# Patient Record
Sex: Female | Born: 1955
Health system: Southern US, Community
[De-identification: ages and names within clinical notes are randomized; demographics above are authoritative.]

## PROBLEM LIST (undated history)

## (undated) DIAGNOSIS — C801 Malignant (primary) neoplasm, unspecified: Secondary | ICD-10-CM

## (undated) DIAGNOSIS — Z96643 Presence of artificial hip joint, bilateral: Secondary | ICD-10-CM

## (undated) DIAGNOSIS — M1990 Unspecified osteoarthritis, unspecified site: Secondary | ICD-10-CM

## (undated) DIAGNOSIS — E876 Hypokalemia: Secondary | ICD-10-CM

## (undated) DIAGNOSIS — F419 Anxiety disorder, unspecified: Secondary | ICD-10-CM

## (undated) DIAGNOSIS — M129 Arthropathy, unspecified: Secondary | ICD-10-CM

## (undated) DIAGNOSIS — F32A Depression, unspecified: Secondary | ICD-10-CM

## (undated) DIAGNOSIS — F329 Major depressive disorder, single episode, unspecified: Secondary | ICD-10-CM

## (undated) HISTORY — DX: Arthropathy, unspecified: M12.9

## (undated) HISTORY — DX: Morbid (severe) obesity due to excess calories: E66.01

## (undated) HISTORY — DX: Unspecified osteoarthritis, unspecified site: M19.90

## (undated) HISTORY — DX: Major depressive disorder, single episode, unspecified: F32.9

## (undated) HISTORY — DX: Depression, unspecified: F32.A

## (undated) HISTORY — PX: ABDOMINAL HYSTERECTOMY: SHX81

## (undated) HISTORY — DX: Hypokalemia: E87.6

## (undated) HISTORY — DX: Presence of artificial hip joint, bilateral: Z96.643

## (undated) HISTORY — DX: Anxiety disorder, unspecified: F41.9

## (undated) HISTORY — PX: BREAST BIOPSY: SHX20

## (undated) HISTORY — PX: OTHER SURGICAL HISTORY: SHX169

---

## 1998-07-31 ENCOUNTER — Encounter: Payer: Self-pay | Admitting: Emergency Medicine

## 1998-07-31 ENCOUNTER — Ambulatory Visit (HOSPITAL_COMMUNITY): Admission: RE | Admit: 1998-07-31 | Discharge: 1998-07-31 | Payer: Self-pay | Admitting: Emergency Medicine

## 1999-02-09 ENCOUNTER — Ambulatory Visit (HOSPITAL_COMMUNITY): Admission: RE | Admit: 1999-02-09 | Discharge: 1999-02-09 | Payer: Self-pay | Admitting: Emergency Medicine

## 1999-02-09 ENCOUNTER — Encounter: Payer: Self-pay | Admitting: Emergency Medicine

## 1999-12-31 ENCOUNTER — Other Ambulatory Visit: Admission: RE | Admit: 1999-12-31 | Discharge: 1999-12-31 | Payer: Self-pay | Admitting: Obstetrics and Gynecology

## 2010-10-28 LAB — HM PAP SMEAR: HM Pap smear: NORMAL

## 2010-12-15 ENCOUNTER — Ambulatory Visit: Payer: Self-pay | Admitting: Family Medicine

## 2011-03-04 ENCOUNTER — Ambulatory Visit: Payer: Self-pay | Admitting: Family Medicine

## 2011-03-24 ENCOUNTER — Ambulatory Visit: Payer: Self-pay | Admitting: Gastroenterology

## 2011-03-24 LAB — HM COLONOSCOPY

## 2011-03-26 LAB — PATHOLOGY REPORT

## 2011-05-14 ENCOUNTER — Ambulatory Visit: Payer: Self-pay | Admitting: Family Medicine

## 2011-05-14 LAB — HCG, QUANTITATIVE, PREGNANCY: Beta Hcg, Quant.: 3 m[IU]/mL

## 2011-07-08 ENCOUNTER — Ambulatory Visit: Payer: Self-pay | Admitting: Obstetrics and Gynecology

## 2011-12-04 ENCOUNTER — Emergency Department: Payer: Self-pay | Admitting: Emergency Medicine

## 2011-12-04 LAB — COMPREHENSIVE METABOLIC PANEL
Albumin: 3.6 g/dL (ref 3.4–5.0)
Alkaline Phosphatase: 111 U/L (ref 50–136)
Anion Gap: 11 (ref 7–16)
BUN: 20 mg/dL — ABNORMAL HIGH (ref 7–18)
Bilirubin,Total: 0.3 mg/dL (ref 0.2–1.0)
Calcium, Total: 8.8 mg/dL (ref 8.5–10.1)
Chloride: 106 mmol/L (ref 98–107)
Co2: 24 mmol/L (ref 21–32)
Creatinine: 0.68 mg/dL (ref 0.60–1.30)
EGFR (African American): >60
EGFR (Non-African Amer.): >60
Glucose: 122 mg/dL — ABNORMAL HIGH (ref 65–99)
Osmolality: 285 (ref 275–301)
Potassium: 3 mmol/L — ABNORMAL LOW (ref 3.5–5.1)
SGOT(AST): 24 U/L (ref 15–37)
SGPT (ALT): 21 U/L (ref 12–78)
Sodium: 141 mmol/L (ref 136–145)
Total Protein: 7.6 g/dL (ref 6.4–8.2)

## 2011-12-04 LAB — CBC
HCT: 41.1 % (ref 35.0–47.0)
HGB: 13.8 g/dL (ref 12.0–16.0)
MCH: 30.2 pg (ref 26.0–34.0)
MCHC: 33.6 g/dL (ref 32.0–36.0)
MCV: 90 fL (ref 80–100)
Platelet: 314 10*3/uL (ref 150–440)
RBC: 4.57 10*6/uL (ref 3.80–5.20)
RDW: 13.9 % (ref 11.5–14.5)
WBC: 10.4 10*3/uL (ref 3.6–11.0)

## 2011-12-04 LAB — TROPONIN I: Troponin-I: <0.02 ng/mL

## 2012-01-04 ENCOUNTER — Ambulatory Visit: Payer: Self-pay | Admitting: Obstetrics and Gynecology

## 2012-06-09 ENCOUNTER — Emergency Department: Payer: Self-pay | Admitting: Emergency Medicine

## 2012-06-09 LAB — URINALYSIS, COMPLETE
Bilirubin,UR: NEGATIVE
Ketone: NEGATIVE
Leukocyte Esterase: NEGATIVE
Nitrite: NEGATIVE
Ph: 7 (ref 4.5–8.0)
Specific Gravity: 1.011 (ref 1.003–1.030)
WBC UR: 111 /HPF (ref 0–5)

## 2012-06-09 LAB — BASIC METABOLIC PANEL
Anion Gap: 3 — ABNORMAL LOW (ref 7–16)
BUN: 12 mg/dL (ref 7–18)
Calcium, Total: 8.5 mg/dL (ref 8.5–10.1)
EGFR (African American): >60
EGFR (Non-African Amer.): >60
Glucose: 96 mg/dL (ref 65–99)
Potassium: 3.5 mmol/L (ref 3.5–5.1)
Sodium: 139 mmol/L (ref 136–145)

## 2012-06-09 LAB — CBC
HCT: 41.9 % (ref 35.0–47.0)
MCHC: 32.5 g/dL (ref 32.0–36.0)
Platelet: 297 10*3/uL (ref 150–440)
WBC: 10.4 10*3/uL (ref 3.6–11.0)

## 2012-10-02 DIAGNOSIS — M1991 Primary osteoarthritis, unspecified site: Secondary | ICD-10-CM | POA: Insufficient documentation

## 2012-11-09 ENCOUNTER — Ambulatory Visit: Payer: Self-pay | Admitting: Obstetrics and Gynecology

## 2012-11-13 ENCOUNTER — Ambulatory Visit: Payer: Self-pay | Admitting: Obstetrics and Gynecology

## 2012-11-14 LAB — CREATININE, SERUM
Creatinine: 0.61 mg/dL (ref 0.60–1.30)
EGFR (African American): >60
EGFR (Non-African Amer.): >60

## 2012-11-14 LAB — HEMOGLOBIN: HGB: 11.1 g/dL — ABNORMAL LOW (ref 12.0–16.0)

## 2012-11-23 LAB — PATHOLOGY REPORT

## 2013-01-04 ENCOUNTER — Ambulatory Visit: Payer: Self-pay | Admitting: Obstetrics and Gynecology

## 2014-01-07 ENCOUNTER — Ambulatory Visit: Payer: Self-pay | Admitting: Obstetrics and Gynecology

## 2014-01-10 ENCOUNTER — Ambulatory Visit: Payer: Self-pay | Admitting: Obstetrics and Gynecology

## 2014-02-19 ENCOUNTER — Ambulatory Visit: Payer: Self-pay | Admitting: Family Medicine

## 2014-02-19 LAB — CBC AND DIFFERENTIAL
HEMATOCRIT: 42 % (ref 36–46)
Hemoglobin: 13.9 g/dL (ref 12.0–16.0)
Platelets: 341 10*3/uL (ref 150–399)
WBC: 8.4 10^3/mL

## 2014-02-19 LAB — TSH: TSH: 1.84 u[IU]/mL (ref 0.41–5.90)

## 2014-02-19 LAB — LIPID PANEL
Cholesterol: 211 mg/dL — AB (ref 0–200)
HDL: 75 mg/dL — AB (ref 35–70)
LDL Cholesterol: 118 mg/dL
Triglycerides: 88 mg/dL (ref 40–160)

## 2014-02-19 LAB — BASIC METABOLIC PANEL
BUN: 17 mg/dL (ref 4–21)
CREATININE: 0.5 mg/dL (ref 0.5–1.1)
GLUCOSE: 91 mg/dL
POTASSIUM: 4.6 mmol/L (ref 3.4–5.3)
SODIUM: 141 mmol/L (ref 137–147)

## 2014-02-19 LAB — HEPATIC FUNCTION PANEL
ALT: 17 U/L (ref 7–35)
AST: 18 U/L (ref 13–35)

## 2014-05-08 ENCOUNTER — Ambulatory Visit: Payer: Self-pay | Admitting: Family Medicine

## 2014-07-23 ENCOUNTER — Ambulatory Visit
Admit: 2014-07-23 | Disposition: A | Discharge status: Home / Self Care | Payer: Self-pay | Attending: Obstetrics and Gynecology | Admitting: Obstetrics and Gynecology

## 2014-07-26 NOTE — Op Note (Signed)
PATIENT NAME:  Andrea Beard, Andrea Beard MR#:  [id number] DATE OF BIRTH:  12/30/1955  DATE OF PROCEDURE:  11/13/2012  PREOPERATIVE DIAGNOSES:  1.  Right adnexal mass/fibroid uterus.  2.  Pelvic organ prolapse.   POSTOPERATIVE DIAGNOSES:  1.  Right adnexal mass/fibroid uterus.  2.  Pelvic organ prolapse.   PROCEDURE: Laparoscopic-assisted vaginal hysterectomy, anterior posterior colporrhaphy.   SURGEON: Brayton Mars, M.D.   FIRST ASSISTANT: Herbert Moors, FNP.   ANESTHESIA: General endotracheal.   INDICATIONS: The patient is a 59 year old married white female, para 2-0-0-2, menopausal, who presents for surgical management of symptomatic uterine fibroids and pelvic organ prolapse.    FINDINGS AT SURGERY: A 7 cm right broad ligament fibroid that was attached to the anterior uterine fundus by a stalk. The ovaries bilaterally were normal. The patient did have a 2nd-degree cystocele and moderate rectocele present.   DESCRIPTION OF PROCEDURE: The patient was brought to the operating room where she was placed in the supine position. General endotracheal anesthesia was induced without difficulty. She was placed in the low lithotomy position using the bumblebee stirrups. A Betadine and ChloraPrep abdominal, perineal, intravaginal prep and drape was performed in the standard fashion. A Foley catheter was placed and was draining clear yellow urine. A Hulka tenaculum was placed onto the cervix to facilitate uterine manipulation.   The LAVH was performed in routine fashion. Subumbilical vertical incision 5 mm in length was made. The Optiview laparoscopic trocar was placed directly into the abdominal pelvic cavity without evidence of bowel or vascular injury. Two other 5 mm ports were placed in the right and left lower quadrants, respectively, under direct visualization. The laparoscopic portion of the hysterectomy was then performed using the Harmonic scalpel and graspers. Kleppinger bipolar forceps were  also used to facilitate hemostasis.   The round ligament on the right was coagulated and cut using the Harmonic scalpel. The utero-ovarian ligaments were likewise clamped, coagulated, and cut using the Harmonic scalpel. The  right-sided broad ligament fibroid was then skeletonized with peritoneal serosa being removed from the mass with sharp and blunt technique using graspers and the Harmonic scalpel. As this was optimally mobilized, the pedicle was isolated and was cauterized using the Kleppinger bipolar forceps. The Harmonic scalpel was then used to transect the mass. This was placed in the cul-de-sac for removal later. The left round ligament was likewise taken down with the Harmonic scalpel as were the utero-ovarian pedicles.   Once this was accomplished, the vaginal portion of the hysterectomy was then performed. The patient was placed in the dorsal lithotomy position. The Hulka tenaculum was removed from the cervix and a double-tooth tenaculum was placed onto the cervix. The weighted speculum was placed into the vagina and a posterior colpotomy was made using Mayo scissors. The uterosacral ligaments were clamped, cut, and stick-tied using 0 Vicryl suture. The cervix was circumscribed using the Bovie cautery. The bladder was dissected off the lower uterine segment through sharp and blunt dissection. Eventually the anterior cul-de-sac was entered. The remainder of the cardinal and broad ligament complexes were clamped, cut, and stick-tied using 0 Vicryl sutures.   Upon taking down all the pedicles, the uterus was removed from the vagina. The fibroid mass  was also removed following removal of the uterus. The posterior cuff was closed using a baseball stitch of 0 chromic.   Next, the anterior colporrhaphy was performed in routine fashion. The angles of the bladder were grasped with Allis clamps. The vaginal mucosa was undermined with Metzenbaum  scissors and incised in the midline. This technique was  carried out to within several centimeters of the urethral meatus. Sharp and blunt dissection were used to dissect the vesicovaginal fascia off of the vagina. Once adequately mobilized, the anterior colporrhaphy was performed using 2-0 Vicryl sutures in a vertical mattress technique. The cystocele was adequately reduced and the excess vagina was trimmed and reapproximated in the midline using 2-0 chromic sutures in a simple interrupted manner.   Following completion of the anterior colporrhaphy, the posterior colporrhaphy was performed in standard fashion. The angles of the vagina were grasped posteriorly with Allis clamps. A diamond-shaped wedge of tissue was removed at the posterior fourchette. The vaginal mucosa was entered bluntly with Metzenbaum scissors and incised in the midline up towards the apex of the vagina. The perirectal fascia was dissected off the vagina through sharp and blunt dissection. Once adequately mobilized, the posterior colporrhaphy was performed using 0 Vicryl suture in a horizontal mattress technique. Excess vaginal mucosa was trimmed. The vagina was reapproximated in the midline using 2-0 chromic sutures.   Once this was accomplished, the vagina was packed with Kerlix gauze, coated with Premarin cream. Repeat laparoscopy was then performed to verify that all pedicles were hemostatic. The pelvis was irrigated and the irrigant fluid was aspirated. Upon completion of the procedure, Dermabond was applied to the abdominal incisions following removal of all instrumentation.  The patient was then awakened, extubated and taken to the recovery room in satisfactory condition.   ESTIMATED BLOOD LOSS: Was 700 mL.   IV FLUIDS: Two liters.   URINE OUTPUT: Was 700 mL.   COUNTS: All instruments, needle, and sponge counts were verified as correct.   The patient did receive Ancef antibiotic prophylaxis.    ____________________________ Alanda Slim DeFrancesco, MD mad:np D: 11/13/2012  16:42:00 ET T: 11/13/2012 20:20:46 ET JOB#: [id number]  cc: [facility] [place] Done A. DeFrancesco, MD, <Dictator>    Alanda Slim DEFRANCESCO MD ELECTRONICALLY SIGNED 11/20/2012 8:30

## 2014-11-22 ENCOUNTER — Ambulatory Visit (INDEPENDENT_AMBULATORY_CARE_PROVIDER_SITE_OTHER): Payer: 59 | Admitting: Family Medicine

## 2014-11-22 ENCOUNTER — Encounter: Payer: Self-pay | Admitting: Family Medicine

## 2014-11-22 VITALS — BP 126/82 | HR 77 | Temp 98.6°F | Resp 16 | Ht 68.0 in | Wt 274.2 lb

## 2014-11-22 DIAGNOSIS — J069 Acute upper respiratory infection, unspecified: Secondary | ICD-10-CM

## 2014-11-22 DIAGNOSIS — R1012 Left upper quadrant pain: Secondary | ICD-10-CM | POA: Insufficient documentation

## 2014-11-22 DIAGNOSIS — R011 Cardiac murmur, unspecified: Secondary | ICD-10-CM | POA: Insufficient documentation

## 2014-11-22 DIAGNOSIS — F419 Anxiety disorder, unspecified: Secondary | ICD-10-CM | POA: Insufficient documentation

## 2014-11-22 DIAGNOSIS — R42 Dizziness and giddiness: Secondary | ICD-10-CM | POA: Insufficient documentation

## 2014-11-22 DIAGNOSIS — F329 Major depressive disorder, single episode, unspecified: Secondary | ICD-10-CM | POA: Insufficient documentation

## 2014-11-22 DIAGNOSIS — R6882 Decreased libido: Secondary | ICD-10-CM | POA: Insufficient documentation

## 2014-11-22 DIAGNOSIS — Z9889 Other specified postprocedural states: Secondary | ICD-10-CM | POA: Insufficient documentation

## 2014-11-22 DIAGNOSIS — R7303 Prediabetes: Secondary | ICD-10-CM | POA: Insufficient documentation

## 2014-11-22 DIAGNOSIS — Z1159 Encounter for screening for other viral diseases: Secondary | ICD-10-CM | POA: Insufficient documentation

## 2014-11-22 DIAGNOSIS — T56891A Toxic effect of other metals, accidental (unintentional), initial encounter: Secondary | ICD-10-CM | POA: Insufficient documentation

## 2014-11-22 DIAGNOSIS — F32A Depression, unspecified: Secondary | ICD-10-CM | POA: Insufficient documentation

## 2014-11-22 DIAGNOSIS — R790 Abnormal level of blood mineral: Secondary | ICD-10-CM | POA: Insufficient documentation

## 2014-11-22 DIAGNOSIS — Z780 Asymptomatic menopausal state: Secondary | ICD-10-CM | POA: Insufficient documentation

## 2014-11-22 DIAGNOSIS — M129 Arthropathy, unspecified: Secondary | ICD-10-CM | POA: Insufficient documentation

## 2014-11-22 DIAGNOSIS — E876 Hypokalemia: Secondary | ICD-10-CM | POA: Insufficient documentation

## 2014-11-22 DIAGNOSIS — D219 Benign neoplasm of connective and other soft tissue, unspecified: Secondary | ICD-10-CM | POA: Insufficient documentation

## 2014-11-22 DIAGNOSIS — B9689 Other specified bacterial agents as the cause of diseases classified elsewhere: Secondary | ICD-10-CM | POA: Insufficient documentation

## 2014-11-22 DIAGNOSIS — M7750 Other enthesopathy of unspecified foot: Secondary | ICD-10-CM | POA: Insufficient documentation

## 2014-11-22 DIAGNOSIS — E668 Other obesity: Secondary | ICD-10-CM | POA: Insufficient documentation

## 2014-11-22 DIAGNOSIS — R252 Cramp and spasm: Secondary | ICD-10-CM | POA: Insufficient documentation

## 2014-11-22 DIAGNOSIS — M25559 Pain in unspecified hip: Secondary | ICD-10-CM | POA: Insufficient documentation

## 2014-11-22 MED ORDER — FLUTICASONE PROPIONATE 50 MCG/ACT NA SUSP: 2.0000 | Freq: Every day | NASAL | Status: DC

## 2014-11-22 MED ORDER — CEFDINIR 300 MG PO CAPS: 300.0000 mg | ORAL_CAPSULE | Freq: Two times a day (BID) | ORAL | Status: DC

## 2014-11-22 NOTE — Progress Notes (Signed)
Subjective:     Patient ID: Andrea Beard, female   DOB: [birth year], 59 y.o.   MRN: [id number]  HPI  Chief Complaint  Patient presents with  . Sinus Problem    Patient comes in office today with concerns of sinus pain and pressure for the past 12 days. Patient reports that she does have a productive cough and has had congestion. OTC medication patient has tried is Sudafed, Tylenol and Mucinex DM  Reports persistent sinus pressure with clear drainage. Husband was sick first.   Review of Systems  Constitutional: Negative for fever and chills.       Objective:   Physical Exam  Constitutional: She appears well-developed and well-nourished. No distress.  Ears: T.M's intact without inflammation Throat: no tonsillar enlargement or exudate Neck:bilateral anterior cervical tenderness without significant enlargement. Lungs: clear     Assessment:    1. Upper respiratory infection - fluticasone (FLONASE) 50 MCG/ACT nasal spray; Place 2 sprays into both nostrils daily.  Dispense: 16 g; Refill: 6 - cefdinir (OMNICEF) 300 MG capsule; Take 1 capsule (300 mg total) by mouth 2 (two) times daily.  Dispense: 20 capsule; Refill: 0    Plan:   Start antibiotic if sinuses not improving in the next week.

## 2014-11-22 NOTE — Patient Instructions (Signed)
Start antibiotic if sinuses not getting better in a few days or your see colored drainage.

## 2014-12-25 ENCOUNTER — Encounter: Payer: Self-pay | Admitting: Obstetrics and Gynecology

## 2015-01-09 ENCOUNTER — Encounter: Payer: Self-pay | Admitting: Obstetrics and Gynecology

## 2015-01-10 ENCOUNTER — Ambulatory Visit (INDEPENDENT_AMBULATORY_CARE_PROVIDER_SITE_OTHER): Payer: 59 | Admitting: Family Medicine

## 2015-01-10 DIAGNOSIS — Z23 Encounter for immunization: Secondary | ICD-10-CM

## 2015-02-06 ENCOUNTER — Encounter: Payer: Self-pay | Admitting: Obstetrics and Gynecology

## 2015-02-12 ENCOUNTER — Ambulatory Visit (INDEPENDENT_AMBULATORY_CARE_PROVIDER_SITE_OTHER): Payer: 59 | Admitting: Obstetrics and Gynecology

## 2015-02-12 ENCOUNTER — Encounter: Payer: Self-pay | Admitting: Obstetrics and Gynecology

## 2015-02-12 VITALS — BP 138/85 | HR 76 | Ht 68.0 in | Wt 276.8 lb

## 2015-02-12 DIAGNOSIS — Z0000 Encounter for general adult medical examination without abnormal findings: Secondary | ICD-10-CM | POA: Diagnosis not present

## 2015-02-12 DIAGNOSIS — N800 Endometriosis of uterus: Secondary | ICD-10-CM | POA: Diagnosis not present

## 2015-02-12 DIAGNOSIS — Z01419 Encounter for gynecological examination (general) (routine) without abnormal findings: Secondary | ICD-10-CM

## 2015-02-12 DIAGNOSIS — Z1231 Encounter for screening mammogram for malignant neoplasm of breast: Secondary | ICD-10-CM

## 2015-02-12 DIAGNOSIS — IMO0002 Reserved for concepts with insufficient information to code with codable children: Secondary | ICD-10-CM

## 2015-02-12 DIAGNOSIS — N8110 Cystocele, unspecified: Secondary | ICD-10-CM

## 2015-02-12 DIAGNOSIS — Z90710 Acquired absence of both cervix and uterus: Secondary | ICD-10-CM | POA: Insufficient documentation

## 2015-02-12 DIAGNOSIS — Z1211 Encounter for screening for malignant neoplasm of colon: Secondary | ICD-10-CM | POA: Diagnosis not present

## 2015-02-12 DIAGNOSIS — N8003 Adenomyosis of the uterus: Secondary | ICD-10-CM

## 2015-02-12 DIAGNOSIS — N809 Endometriosis, unspecified: Secondary | ICD-10-CM

## 2015-02-12 DIAGNOSIS — N8111 Cystocele, midline: Secondary | ICD-10-CM | POA: Insufficient documentation

## 2015-02-12 NOTE — Progress Notes (Signed)
Patient ID: Andrea Beard, female   DOB: July 30, 1955, 59 y.o.   MRN: [id number] ANNUAL PREVENTATIVE CARE GYN  ENCOUNTER NOTE  Subjective:       Andrea Beard is a 59 y.o. G3P2 female here for a routine annual gynecologic exam.  Current complaints: 1.  Decreased libido 2.  Cystocele   Gynecologic History No LMP recorded. Patient has had a hysterectomy. Contraception: status post hysterectomyLAVH with anterior, posterior colporrhaphy Last Pap: n/a. Results were: normal Last mammogram: 07/2014. Results were: normal birad 2 Recurrent grade 2 cystocele, minimally symptomatic  Obstetric History OB History  Gravida Para Term Preterm AB SAB TAB Ectopic Multiple Living  3 2            # Outcome Date GA Lbr Len/2nd Weight Sex Delivery Anes PTL Lv  3 Gravida           2 Para           1 Para               Past Medical History  Diagnosis Date  . Morbid obesity ([place])   . H/O bilateral hip replacements   . Arthropathy   . Hypopotassemia   . Depression   . Anxiety     Past Surgical History  Procedure Laterality Date  . Abdominal hysterectomy      Dr. Quenten Raven  . Left hip raplacement  Left 2008 and right 2010    No current outpatient prescriptions on file prior to visit.   No current facility-administered medications on file prior to visit.    Allergies  Allergen Reactions  . Oxycodone Rash    Social History   Social History  . Marital Status: Married    Spouse Name: N/A  . Number of Children: N/A  . Years of Education: N/A   Occupational History  . Not on file.   Social History Main Topics  . Smoking status: Never Smoker   . Smokeless tobacco: Never Used  . Alcohol Use: No     Comment: Non drinker/ No alcohol use.  . Drug Use: No  . Sexual Activity: Yes    Birth Control/ Protection: Surgical   Other Topics Concern  . Not on file   Social History Narrative    Family History  Problem Relation Age of Onset  . Hypertension Mother   . Cancer  Mother     Breast  . Heart disease Father   . Hypertension Father   . Arthritis Sister   . Thyroid disease Brother   . Arthritis Sister     arthritis with bilateral hip replacent and multiple myeloma disease  . Diabetes Paternal Grandfather     The following portions of the patient's history were reviewed and updated as appropriate: allergies, current medications, past family history, past medical history, past social history, past surgical history and problem list.  Review of Systems ROS Review of Systems - General ROS: negative for - chills, fatigue, fever, hot flashes, night sweats, weight gain or weight loss Psychological ROS: negative for - anxiety, decreased libido, depression, mood swings, physical abuse or sexual abuse Ophthalmic ROS: negative for - blurry vision, eye pain or loss of vision ENT ROS: negative for - headaches, hearing change, visual changes or vocal changes Allergy and Immunology ROS: negative for - hives, itchy/watery eyes or seasonal allergies Hematological and Lymphatic ROS: negative for - bleeding problems, bruising, swollen lymph nodes or weight loss Endocrine ROS: negative for - galactorrhea, hair pattern changes, hot  flashes, malaise/lethargy, mood swings, palpitations, polydipsia/polyuria, skin changes, temperature intolerance or unexpected weight changes Breast ROS: negative for - new or changing breast lumps or nipple discharge Respiratory ROS: negative for - cough or shortness of breath Cardiovascular ROS: negative for - chest pain, irregular heartbeat, palpitations or shortness of breath Gastrointestinal ROS: no abdominal pain, change in bowel habits, or black or bloody stools Genito-Urinary ROS: no dysuria, trouble voiding, or hematuria Musculoskeletal ROS: negative for - joint pain or joint stiffness Neurological ROS: negative for - bowel and bladder control changes Dermatological ROS: negative for rash and skin lesion changes   Objective:   BP  138/85 mmHg  Pulse 76  Ht 5' 8"  (1.727 m)  Wt 276 lb 12.8 oz (125.556 kg)  BMI 42.10 kg/m2 CONSTITUTIONAL: Well-developed, well-nourished female in no acute distress.  PSYCHIATRIC: Normal mood and affect. Normal behavior. Normal judgment and thought content. [place]: Alert and oriented to person, place, and time. Normal muscle tone coordination. No cranial nerve deficit noted. HENT:  Normocephalic, atraumatic, External right and left ear normal. Oropharynx is clear and moist EYES: Conjunctivae and EOM are normal. Pupils are equal, round, and reactive to light. No scleral icterus.  NECK: Normal range of motion, supple, no masses.  Normal thyroid.  SKIN: Skin is warm and dry. No rash noted. Not diaphoretic. No erythema. No pallor. CARDIOVASCULAR: Normal heart rate noted, regular rhythm, no murmur. RESPIRATORY: Clear to auscultation bilaterally. Effort and breath sounds normal, no problems with respiration noted. BREASTS: Symmetric in size. No masses, skin changes, nipple drainage, or lymphadenopathy. ABDOMEN: Soft, normal bowel sounds, no distention noted.  No tenderness, rebound or guarding.  BLADDER: Normal PELVIC:  External Genitalia: Normal  BUS: Normal  Vagina: Second-degree cystocele; good apex support; no rectocele  Cervix: Surgically absent  Uterus: Surgically absent  Adnexa: Normal  RV: External Exam NormaI, No Rectal Masses and Normal Sphincter tone  MUSCULOSKELETAL: Normal range of motion. No tenderness.  No cyanosis, clubbing, or edema.  2+ distal pulses. LYMPHATIC: No Axillary, Supraclavicular, or Inguinal Adenopathy.    Assessment:   Annual gynecologic examination 59 y.o. Contraception: status post hysterectomyLAVH with anterior, posterior colporrhaphy Obesity 1 Decreased libido. Recurrent second-degree cystocele, minimally symptomatic  Plan:  Pap: Not done Mammogram: Ordered Stool Guaiac Testing:  Ordered Labs: thru pcp Routine preventative health maintenance  measures emphasized: Exercise/Diet/Weight control, Tobacco Warnings and Alcohol/Substance use risks Calcium with vitamin D supplementation.  Encouraged Exercise and weight loss goal of 1 pound per month.  Encouraged Return to [place], [place]  Brayton Mars, MD  Note: This dictation was prepared with Dragon dictation along with smaller phrase technology. Any transcriptional errors that result from this process are unintentional.

## 2015-02-12 NOTE — Patient Instructions (Signed)
1.  No Pap smear is needed. 2.  Mammogram to be scheduled. 3.  Stool cards for colon cancer screening are given. 4.  Lab work is to be done through Dr. Rosanna Randy. 5.  Continue with exercise and weight loss with a goal of 1 pound per month. 6.  Encourage calcium with vitamin D supplementation using either Tums or Citracal. 7.  Return in one year for annual exam. 8.  Moderate bladder prolapse persists and is stable from exam last year.  No intervention is needed at this time. 9.  Options of management for libido were given including trial of testosterone..  She may call me if she desires to proceed with a trial. 10.  Menopausal state, minimally symptomatic; patient has stopped estrogen therapy and is comfortable at this time.

## 2015-02-21 ENCOUNTER — Ambulatory Visit
Admission: RE | Admit: 2015-02-21 | Discharge: 2015-02-21 | Disposition: A | Discharge status: Home / Self Care | Payer: 59 | Source: Ambulatory Visit | Attending: Obstetrics and Gynecology | Admitting: Obstetrics and Gynecology

## 2015-02-21 DIAGNOSIS — Z1231 Encounter for screening mammogram for malignant neoplasm of breast: Secondary | ICD-10-CM

## 2015-02-21 HISTORY — DX: Malignant (primary) neoplasm, unspecified: C80.1

## 2015-02-25 ENCOUNTER — Ambulatory Visit (INDEPENDENT_AMBULATORY_CARE_PROVIDER_SITE_OTHER): Payer: 59 | Admitting: Family Medicine

## 2015-02-25 ENCOUNTER — Encounter: Payer: Self-pay | Admitting: Family Medicine

## 2015-02-25 VITALS — BP 126/84 | HR 64 | Temp 98.1°F | Resp 12 | Ht 67.5 in | Wt 280.0 lb

## 2015-02-25 DIAGNOSIS — J069 Acute upper respiratory infection, unspecified: Secondary | ICD-10-CM | POA: Diagnosis not present

## 2015-02-25 DIAGNOSIS — R739 Hyperglycemia, unspecified: Secondary | ICD-10-CM

## 2015-02-25 DIAGNOSIS — F329 Major depressive disorder, single episode, unspecified: Secondary | ICD-10-CM | POA: Diagnosis not present

## 2015-02-25 DIAGNOSIS — Z0000 Encounter for general adult medical examination without abnormal findings: Secondary | ICD-10-CM

## 2015-02-25 DIAGNOSIS — R7303 Prediabetes: Secondary | ICD-10-CM | POA: Insufficient documentation

## 2015-02-25 DIAGNOSIS — F32A Depression, unspecified: Secondary | ICD-10-CM

## 2015-02-25 LAB — POCT URINALYSIS DIPSTICK
Bilirubin, UA: NEGATIVE
Glucose, UA: NEGATIVE
Ketones, UA: NEGATIVE
Leukocytes, UA: NEGATIVE
NITRITE UA: NEGATIVE
PH UA: 6
PROTEIN UA: NEGATIVE
RBC UA: NEGATIVE
Spec Grav, UA: 1.02
UROBILINOGEN UA: NEGATIVE

## 2015-02-25 MED ORDER — ALPRAZOLAM 0.5 MG PO TBDP: ORAL_TABLET | ORAL | Status: DC

## 2015-02-25 NOTE — Progress Notes (Signed)
Patient ID: Andrea Beard, female   DOB: 1955/09/14, 59 y.o.   MRN: [id number]  Visit Date: 02/25/2015  Today's Provider: Wilhemena Durie, MD   Chief Complaint  Patient presents with  . Annual Exam   Subjective:  Andrea Beard is a 59 y.o. female who presents today for health maintenance and complete physical. She feels fairly well. She reports she is sleeping well. LAST: Pap smear with Dr. Melody Haver on 02/12/15  Colonoscopy and Endoscopy 03/24/11-repeat colonoscopy in 5 years  Mammogram 02/21/15.  Up to date on Flu shot. Tdap patient remembers having it most likely in [place] but she is not sure how long ago, we do not have a record.   Review of Systems  Constitutional: Positive for fatigue.  HENT: Positive for congestion, postnasal drip and sinus pressure.   Eyes: Negative.   Respiratory: Negative.   Cardiovascular: Negative.   Gastrointestinal: Positive for abdominal pain and abdominal distention.  Endocrine: Negative.   Genitourinary: Negative.   Musculoskeletal: Positive for back pain, arthralgias and neck pain.  Skin: Negative.   Allergic/Immunologic: Negative.   Neurological: Negative.   Hematological: Negative.   Psychiatric/Behavioral: Negative.     Social History   Social History  . Marital Status: Married    Spouse Name: N/A  . Number of Children: N/A  . Years of Education: N/A   Occupational History  . Not on file.   Social History Main Topics  . Smoking status: Never Smoker   . Smokeless tobacco: Never Used  . Alcohol Use: No     Comment: Non drinker/ No alcohol use.  . Drug Use: No  . Sexual Activity: Yes    Birth Control/ Protection: Surgical   Other Topics Concern  . Not on file   Social History Narrative    Patient Active Problem List   Diagnosis Date Noted  . 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 Cystocele 02/12/2015  . Status post laparoscopic assisted vaginal hysterectomy (LAVH) 02/12/2015  . Adenomyosis 02/12/2015   . Abdominal pain, left upper quadrant 11/22/2014  . Abnormal blood level of cobalt 11/22/2014  . Anxiety 11/22/2014  . Arthropathia 11/22/2014  . Decreased libido 11/22/2014  . Clinical depression 11/22/2014  . Fibroid 11/22/2014  . Borderline diabetes 11/22/2014  . History of repair of hip joint 11/22/2014  . Cardiac murmur 11/22/2014  . Titanium poisoning 11/22/2014  . Arthralgia of hip 11/22/2014  . Decreased potassium in the blood 11/22/2014  . Asymptomatic postmenopausal status 11/22/2014  . Extreme obesity ([place]) 11/22/2014  . Cramp of limb 11/22/2014  . Bacterial upper respiratory infection 11/22/2014  . Screening examination for poliomyelitis 11/22/2014  . Foot tendinitis 11/22/2014  . Head revolving around 11/22/2014  . Menopause 11/22/2014  . Idiopathic localized osteoarthropathy 10/02/2012    Past Surgical History  Procedure Laterality Date  . Abdominal hysterectomy      Dr. Quenten Raven  . Left hip raplacement  Left 2008 and right 2010  . Breast biopsy Right     negative    Her family history includes Arthritis in her sister and sister; Bladder Cancer in her brother; Breast cancer (age of onset: 62) in her mother; Cancer in her mother; Diabetes in her paternal grandfather; Esophageal cancer in her brother; Heart disease in her father; Hypertension in her father and mother; Melanoma in her brother; Stomach cancer in her brother; Thyroid disease in her brother.    No outpatient prescriptions prior to visit.   No facility-administered medications prior to visit.  Patient Care Team: Jerrol Banana., MD as PCP - General (Family Medicine)     Objective:   Vitals:  Filed Vitals:   02/25/15 1006  BP: 126/84  Pulse: 64  Temp: 98.1 F (36.7 C)  Resp: 12  Height: 5' 7.5" (1.715 m)  Weight: 280 lb (127.007 kg)    Physical Exam  Constitutional: She is oriented to person, place, and time. She appears well-developed and well-nourished.  Morbidly  obese--pear shaped.  HENT:  Head: Normocephalic and atraumatic.  Right Ear: External ear normal.  Left Ear: External ear normal.  Nose: Nose normal.  Eyes: Conjunctivae and EOM are normal. Pupils are equal, round, and reactive to light.  Neck: Neck supple.  Cardiovascular: Normal rate, regular rhythm and normal heart sounds.   Pulmonary/Chest: Effort normal and breath sounds normal.  Abdominal: Soft.  Neurological: She is alert and oriented to person, place, and time.  Skin: Skin is warm and dry.  Psychiatric: She has a normal mood and affect. Her behavior is normal. Judgment and thought content normal.     Depression Screen PHQ 2/9 Scores 02/25/2015  PHQ - 2 Score 0      Assessment & Plan:   1. Annual physical exam Tdap 2009. - CBC with Differential/Platelet - Comprehensive metabolic panel - Lipid Panel With LDL/HDL Ratio - TSH - POCT Urinalysis Dipstick  2. Hyperglycemia - HgB A1c  3. Clinical depression  4. Upper respiratory infection  5. Apnea [place] today is 8.May need sleep study. 6.Morbid Obesity 7.s/p Bilateral Hip Replacement 8.URI I have done the exam and reviewed the above chart and it is accurate to the best of my knowledge.

## 2015-02-26 ENCOUNTER — Telehealth: Payer: Self-pay

## 2015-02-26 LAB — COMPREHENSIVE METABOLIC PANEL
A/G RATIO: 1.4 (ref 1.1–2.5)
ALT: 17 IU/L (ref 0–32)
AST: 15 IU/L (ref 0–40)
Albumin: 4.2 g/dL (ref 3.5–5.5)
Alkaline Phosphatase: 91 IU/L (ref 39–117)
BILIRUBIN TOTAL: 0.3 mg/dL (ref 0.0–1.2)
BUN/Creatinine Ratio: 30 — ABNORMAL HIGH (ref 9–23)
BUN: 17 mg/dL (ref 6–24)
CHLORIDE: 99 mmol/L (ref 97–106)
CO2: 26 mmol/L (ref 18–29)
Calcium: 9.8 mg/dL (ref 8.7–10.2)
Creatinine, Ser: 0.56 mg/dL — ABNORMAL LOW (ref 0.57–1.00)
GFR calc Af Amer: 121 mL/min/{1.73_m2} (ref >59)
GFR calc non Af Amer: 105 mL/min/{1.73_m2} (ref >59)
GLOBULIN, TOTAL: 2.9 g/dL (ref 1.5–4.5)
Glucose: 88 mg/dL (ref 65–99)
POTASSIUM: 4.7 mmol/L (ref 3.5–5.2)
SODIUM: 140 mmol/L (ref 136–144)
TOTAL PROTEIN: 7.1 g/dL (ref 6.0–8.5)

## 2015-02-26 LAB — CBC WITH DIFFERENTIAL/PLATELET
BASOS: 0 %
Basophils Absolute: 0 10*3/uL (ref 0.0–0.2)
EOS (ABSOLUTE): 0.2 10*3/uL (ref 0.0–0.4)
Eos: 2 %
Hematocrit: 40.4 % (ref 34.0–46.6)
Hemoglobin: 13.6 g/dL (ref 11.1–15.9)
Immature Grans (Abs): 0 10*3/uL (ref 0.0–0.1)
Immature Granulocytes: 0 %
Lymphocytes Absolute: 2.5 10*3/uL (ref 0.7–3.1)
Lymphs: 27 %
MCH: 29.7 pg (ref 26.6–33.0)
MCHC: 33.7 g/dL (ref 31.5–35.7)
MCV: 88 fL (ref 79–97)
MONOS ABS: 0.7 10*3/uL (ref 0.1–0.9)
Monocytes: 7 %
NEUTROS ABS: 6 10*3/uL (ref 1.4–7.0)
Neutrophils: 64 %
PLATELETS: 336 10*3/uL (ref 150–379)
RBC: 4.58 x10E6/uL (ref 3.77–5.28)
RDW: 13.9 % (ref 12.3–15.4)
WBC: 9.4 10*3/uL (ref 3.4–10.8)

## 2015-02-26 LAB — TSH: TSH: 1.23 u[IU]/mL (ref 0.450–4.500)

## 2015-02-26 LAB — LIPID PANEL WITH LDL/HDL RATIO
Cholesterol, Total: 216 mg/dL — ABNORMAL HIGH (ref 100–199)
HDL: 76 mg/dL (ref >39)
LDL Calculated: 126 mg/dL — ABNORMAL HIGH (ref 0–99)
LDl/HDL Ratio: 1.7 ratio units (ref 0.0–3.2)
Triglycerides: 72 mg/dL (ref 0–149)
VLDL Cholesterol Cal: 14 mg/dL (ref 5–40)

## 2015-02-26 LAB — HEMOGLOBIN A1C
Est. average glucose Bld gHb Est-mCnc: 123 mg/dL
HEMOGLOBIN A1C: 5.9 % — AB (ref 4.8–5.6)

## 2015-02-26 NOTE — Telephone Encounter (Signed)
Advised  ED 

## 2015-02-26 NOTE — Telephone Encounter (Signed)
-----   Message from Jerrol Banana., MD sent at 02/26/2015 11:11 AM EST ----- Labs okay

## 2015-02-26 NOTE — Telephone Encounter (Signed)
LMTCB ED 

## 2015-03-26 ENCOUNTER — Encounter: Payer: Self-pay | Admitting: Family Medicine

## 2015-03-27 ENCOUNTER — Encounter: Payer: Self-pay | Admitting: Family Medicine

## 2015-06-18 ENCOUNTER — Encounter: Payer: Self-pay | Admitting: Family Medicine

## 2015-06-24 DIAGNOSIS — I340 Nonrheumatic mitral (valve) insufficiency: Secondary | ICD-10-CM | POA: Insufficient documentation

## 2015-06-24 DIAGNOSIS — R079 Chest pain, unspecified: Secondary | ICD-10-CM | POA: Insufficient documentation

## 2015-06-24 DIAGNOSIS — E782 Mixed hyperlipidemia: Secondary | ICD-10-CM | POA: Insufficient documentation

## 2015-07-25 ENCOUNTER — Encounter: Payer: Self-pay | Admitting: Sports Medicine

## 2015-07-25 ENCOUNTER — Ambulatory Visit (INDEPENDENT_AMBULATORY_CARE_PROVIDER_SITE_OTHER): Payer: BLUE CROSS/BLUE SHIELD

## 2015-07-25 ENCOUNTER — Ambulatory Visit (INDEPENDENT_AMBULATORY_CARE_PROVIDER_SITE_OTHER): Payer: BLUE CROSS/BLUE SHIELD | Admitting: Sports Medicine

## 2015-07-25 VITALS — BP 126/85 | HR 70 | Resp 16

## 2015-07-25 DIAGNOSIS — M79673 Pain in unspecified foot: Secondary | ICD-10-CM

## 2015-07-25 DIAGNOSIS — M779 Enthesopathy, unspecified: Secondary | ICD-10-CM

## 2015-07-25 DIAGNOSIS — M6588 Other synovitis and tenosynovitis, other site: Secondary | ICD-10-CM | POA: Diagnosis not present

## 2015-07-25 DIAGNOSIS — M7750 Other enthesopathy of unspecified foot: Secondary | ICD-10-CM | POA: Diagnosis not present

## 2015-07-25 DIAGNOSIS — M778 Other enthesopathies, not elsewhere classified: Secondary | ICD-10-CM

## 2015-07-25 MED ORDER — METHYLPREDNISOLONE 4 MG PO TBPK: ORAL_TABLET | ORAL | Status: DC

## 2015-07-25 MED ORDER — MELOXICAM 15 MG PO TABS: 15.0000 mg | ORAL_TABLET | Freq: Every day | ORAL | Status: DC

## 2015-07-25 NOTE — Progress Notes (Deleted)
   Subjective:    Patient ID: Andrea Beard, female    DOB: 04-19-1955, 60 y.o.   MRN: [id number]  HPI    Review of Systems  All other systems reviewed and are negative.      Objective:   Physical Exam        Assessment & Plan:

## 2015-07-26 MED ORDER — TRIAMCINOLONE ACETONIDE 10 MG/ML IJ SUSP: 10.0000 mg | Freq: Once | INTRAMUSCULAR | Status: DC

## 2015-07-26 NOTE — Progress Notes (Signed)
Patient ID: Andrea Beard, female   DOB: [birth year], 58 y.o.   MRN: [id number] Subjective: Andrea Beard is a 60 y.o. female patient who presents to office for evaluation of Right>left foot pain. Patient complains of progressive pain especially over the last several months with increased activity with walking to lose weight; reports that for several years she has always had problems; Admits that pain feels like dull ache. Patient has tried ice and ibuprofen with no relief in symptoms. Admits that changing her shoes have helped. Patient denies any other pedal complaints. Denies injury/trip/fall/sprain/any other causative factors.   Patient Active Problem List   Diagnosis Date Noted  . Chest pain 06/24/2015  . Combined fat and carbohydrate induced hyperlipemia 06/24/2015  . MI (mitral incompetence) 06/24/2015  . 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 Cystocele 02/12/2015  . Status post laparoscopic assisted vaginal hysterectomy (LAVH) 02/12/2015  . Adenomyosis 02/12/2015  . Abdominal pain, left upper quadrant 11/22/2014  . Abnormal blood level of cobalt 11/22/2014  . Anxiety 11/22/2014  . Arthropathia 11/22/2014  . Decreased libido 11/22/2014  . Clinical depression 11/22/2014  . Fibroid 11/22/2014  . Borderline diabetes 11/22/2014  . History of repair of hip joint 11/22/2014  . Cardiac murmur 11/22/2014  . Titanium poisoning 11/22/2014  . Arthralgia of hip 11/22/2014  . Decreased potassium in the blood 11/22/2014  . Asymptomatic postmenopausal status 11/22/2014  . Extreme obesity ([place]) 11/22/2014  . Cramp of limb 11/22/2014  . Bacterial upper respiratory infection 11/22/2014  . Screening examination for poliomyelitis 11/22/2014  . Foot tendinitis 11/22/2014  . Head revolving around 11/22/2014  . Menopause 11/22/2014  . Idiopathic localized osteoarthropathy 10/02/2012    No current outpatient prescriptions on file prior to visit.   No current facility-administered  medications on file prior to visit.    Allergies  Allergen Reactions  . Oxycodone Rash    Objective:  General: Alert and oriented x3 in no acute distress  Dermatology: No open lesions bilateral lower extremities, no webspace macerations, no ecchymosis bilateral, all nails x 10 are well manicured.  Vascular: Dorsalis Pedis and Posterior Tibial pedal pulses palpable, Capillary Fill Time 3 seconds,(+) pedal hair growth bilateral, no edema bilateral lower extremities, Temperature gradient within normal limits.  Neurology: Johney Maine sensation intact via light touch bilateral, Protective sensation intact  with Thornell Mule Monofilament to all pedal sites, Position sense intact, vibratory intact bilateral, Deep tendon reflexes within normal limits bilateral, No babinski sign present bilateral. (- )Tinels sign bilateral.   Musculoskeletal: Mild tenderness with palpation at right>left midfoot with mild exostosis and minimal pain with extensor tendons right>left,No pain with calf compression bilateral. Ankle and pedal range of motion within normal limits. Collapsing planus bilateral. Strength within normal limits in all groups bilateral.   Xrays  Left and Right Foot   Impression: Normal osseous mineralization. Joint space narrowing with spurs suggestive of arthritis at anterior ankle and dorsal midfoot L>R, midtarsal breach, mild inferior calcaneal spur, soft tissues within normal limits. No other acute findings.  Assessment and Plan: Problem List Items Addressed This Visit    None    Visit Diagnoses    Foot pain, unspecified laterality    -  Primary    Relevant Medications    methylPREDNISolone (MEDROL DOSEPAK) 4 MG TBPK tablet    meloxicam (MOBIC) 15 MG tablet    triamcinolone acetonide (KENALOG) 10 MG/ML injection 10 mg (Start on 07/26/2015  2:30 PM)    Other Relevant Orders    DG Foot 2  Views Left    DG Foot 2 Views Right    Capsulitis of foot, unspecified laterality        R>L     Relevant Medications    methylPREDNISolone (MEDROL DOSEPAK) 4 MG TBPK tablet    meloxicam (MOBIC) 15 MG tablet    triamcinolone acetonide (KENALOG) 10 MG/ML injection 10 mg (Start on 07/26/2015  2:30 PM)    Tendonitis of foot        Relevant Medications    methylPREDNISolone (MEDROL DOSEPAK) 4 MG TBPK tablet    meloxicam (MOBIC) 15 MG tablet    triamcinolone acetonide (KENALOG) 10 MG/ML injection 10 mg (Start on 07/26/2015  2:30 PM)        -Complete examination performed -Xrays reviewed -Discussed treatement options -After oral consent and aseptic prep, injected a mixture containing 1 ml of 2%  plain lidocaine, 1 ml 0.5% plain marcaine, 0.5 ml of kenalog 10 and 0.5 ml of dexamethasone phosphate right dorsal midfoot without complication. Post-injection care discussed with patient.  -Rx Mobic to start after Medrol dose pack is completed -Recommended good supportive shoes and inserts; advised patient to lace shoes to avoid irritation to midfoot; advised custom inserts if OTC inserts fail to offer relief -Recommend icing 1-2x daily  -Continue with activities to tolerance -Patient to return to office 3 weeks for recheck or sooner if condition worsens.  Landis Martins, DPM

## 2015-08-06 ENCOUNTER — Encounter: Payer: Self-pay | Admitting: Family Medicine

## 2015-08-06 ENCOUNTER — Ambulatory Visit (INDEPENDENT_AMBULATORY_CARE_PROVIDER_SITE_OTHER): Payer: BLUE CROSS/BLUE SHIELD | Admitting: Family Medicine

## 2015-08-06 VITALS — BP 116/72 | HR 80 | Temp 98.2°F | Resp 20 | Ht 68.0 in | Wt 258.0 lb

## 2015-08-06 DIAGNOSIS — J45909 Unspecified asthma, uncomplicated: Secondary | ICD-10-CM | POA: Diagnosis not present

## 2015-08-06 MED ORDER — LEVALBUTEROL HCL 1.25 MG/3ML IN NEBU: 1.2500 mg | INHALATION_SOLUTION | Freq: Once | RESPIRATORY_TRACT | Status: DC

## 2015-08-06 MED ORDER — AZITHROMYCIN 250 MG PO TABS: ORAL_TABLET | ORAL | Status: DC

## 2015-08-06 MED ORDER — ALBUTEROL SULFATE HFA 108 (90 BASE) MCG/ACT IN AERS: 1.0000 | INHALATION_SPRAY | RESPIRATORY_TRACT | Status: DC | PRN

## 2015-08-06 MED ORDER — LEVALBUTEROL HCL 1.25 MG/0.5ML IN NEBU: 1.2500 mg | INHALATION_SOLUTION | Freq: Once | RESPIRATORY_TRACT | Status: DC

## 2015-08-06 NOTE — Patient Instructions (Signed)
Continue to take Mucinex daily.

## 2015-08-06 NOTE — Progress Notes (Signed)
Patient ID: Andrea Beard, female   DOB: 11-May-1955, 60 y.o.   MRN: [id number]       Patient: Andrea Beard Female    DOB: [birth year]   60 y.o.   MRN: [id number] Visit Date: 08/06/2015  Today's Provider: Wilhemena Durie, MD   Chief Complaint  Patient presents with  . URI    x 10 days.    Subjective:    URI  This is a new problem. The current episode started in the past 7 days. The problem has been unchanged. There has been no fever. Associated symptoms include congestion, coughing, headaches, a plugged ear sensation, rhinorrhea, sinus pain and a sore throat. She has tried acetaminophen, decongestant and antihistamine for the symptoms. The treatment provided mild relief.       Allergies  Allergen Reactions  . Oxycodone Rash   Previous Medications   MELOXICAM (MOBIC) 15 MG TABLET    Take 1 tablet (15 mg total) by mouth daily.    Review of Systems  Constitutional: Positive for fever, fatigue and unexpected weight change. Negative for activity change and appetite change.  HENT: Positive for congestion, postnasal drip, rhinorrhea and sore throat.   Eyes: Negative.   Respiratory: Positive for cough.   Endocrine: Negative.   Musculoskeletal: Negative.   Neurological: Positive for headaches.  Psychiatric/Behavioral: Negative.     Social History  Substance Use Topics  . Smoking status: Never Smoker   . Smokeless tobacco: Never Used  . Alcohol Use: No     Comment: Non drinker/ No alcohol use.   Objective:   BP 116/72 mmHg  Pulse 80  Temp(Src) 98.2 F (36.8 C)  Resp 20  Ht 5\' 8"  (1.727 m)  Wt 258 lb (117.028 kg)  BMI 39.24 kg/m2  SpO2 95%  Physical Exam  Constitutional: She is oriented to person, place, and time. She appears well-developed and well-nourished.  HENT:  Head: Normocephalic and atraumatic.  Right Ear: External ear normal.  Left Ear: External ear normal.  Nose: Nose normal.  Mouth/Throat: Oropharynx is clear and moist.  Eyes:  Conjunctivae and EOM are normal. Pupils are equal, round, and reactive to light.  Neck: Normal range of motion. Neck supple.  Cardiovascular: Normal rate, regular rhythm and normal heart sounds.   Pulmonary/Chest: Effort normal. She has wheezes.  Expiatory wheezing throughout all 4 lungs.   Abdominal: Soft.  Neurological: She is alert and oriented to person, place, and time.  Skin: Skin is warm and dry.  Psychiatric: She has a normal mood and affect. Her behavior is normal. Judgment and thought content normal.  very mild wheezing improved after albuterol nebulizer therapy.      Assessment & Plan:  1. Asthmatic bronchitis, unspecified asthma severity, uncomplicated/mild Push fluids and expectorants for now. Lots of rest. - albuterol (PROVENTIL HFA;VENTOLIN HFA) 108 (90 Base) MCG/ACT inhaler; Inhale 1-2 puffs into the lungs every 4 (four) hours as needed for wheezing or shortness of breath.  Dispense: 1 Inhaler; Refill: 2 - azithromycin (ZITHROMAX) 250 MG tablet; Take 2 tablets today, then take 1 tablet daily thereafter.  Dispense: 6 tablet; Refill: 0 - levalbuterol (XOPENEX) nebulizer solution 1.25 mg; Take 1.25 mg by nebulization once. 2. Obesity I praised the patient that she has lost 30 pounds since her last visit just with dietary changes. She is very pleased with this. I have done the exam and reviewed the above chart and it is accurate to the best of my knowledge.  Richard Cranford Mon, MD  [facility]

## 2015-08-22 ENCOUNTER — Ambulatory Visit (INDEPENDENT_AMBULATORY_CARE_PROVIDER_SITE_OTHER): Payer: BLUE CROSS/BLUE SHIELD | Admitting: Sports Medicine

## 2015-08-22 ENCOUNTER — Encounter: Payer: Self-pay | Admitting: Sports Medicine

## 2015-08-22 DIAGNOSIS — M7750 Other enthesopathy of unspecified foot: Secondary | ICD-10-CM

## 2015-08-22 DIAGNOSIS — M6588 Other synovitis and tenosynovitis, other site: Secondary | ICD-10-CM

## 2015-08-22 DIAGNOSIS — M79673 Pain in unspecified foot: Secondary | ICD-10-CM

## 2015-08-22 DIAGNOSIS — M779 Enthesopathy, unspecified: Principal | ICD-10-CM

## 2015-08-22 DIAGNOSIS — M778 Other enthesopathies, not elsewhere classified: Secondary | ICD-10-CM

## 2015-08-22 NOTE — Progress Notes (Signed)
Patient ID: Andrea Beard, female   DOB: 1955/07/30, 60 y.o.   MRN: [id number]  Subjective: Andrea Beard is a 60 y.o. female patient who returns to office for evaluation of Right>left foot pain. Patient states that the injection at the top of her right foot helped tremendously. Pain is much better. Pain pretty much gone. Denies any other pedal complaints.   Patient Active Problem List   Diagnosis Date Noted  . Chest pain 06/24/2015  . Combined fat and carbohydrate induced hyperlipemia 06/24/2015  . MI (mitral incompetence) 06/24/2015  . 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 Cystocele 02/12/2015  . Status post laparoscopic assisted vaginal hysterectomy (LAVH) 02/12/2015  . Adenomyosis 02/12/2015  . Abdominal pain, left upper quadrant 11/22/2014  . Abnormal blood level of cobalt 11/22/2014  . Anxiety 11/22/2014  . Arthropathia 11/22/2014  . Decreased libido 11/22/2014  . Clinical depression 11/22/2014  . Fibroid 11/22/2014  . Borderline diabetes 11/22/2014  . History of repair of hip joint 11/22/2014  . Cardiac murmur 11/22/2014  . Titanium poisoning 11/22/2014  . Arthralgia of hip 11/22/2014  . Decreased potassium in the blood 11/22/2014  . Asymptomatic postmenopausal status 11/22/2014  . Extreme obesity ([place]) 11/22/2014  . Cramp of limb 11/22/2014  . Bacterial upper respiratory infection 11/22/2014  . Screening examination for poliomyelitis 11/22/2014  . Foot tendinitis 11/22/2014  . Head revolving around 11/22/2014  . Menopause 11/22/2014  . Idiopathic localized osteoarthropathy 10/02/2012    Current Outpatient Prescriptions on File Prior to Visit  Medication Sig Dispense Refill  . albuterol (PROVENTIL HFA;VENTOLIN HFA) 108 (90 Base) MCG/ACT inhaler Inhale 1-2 puffs into the lungs every 4 (four) hours as needed for wheezing or shortness of breath. 1 Inhaler 2  . azithromycin (ZITHROMAX) 250 MG tablet Take 2 tablets today, then take 1 tablet daily thereafter. 6  tablet 0  . meloxicam (MOBIC) 15 MG tablet Take 1 tablet (15 mg total) by mouth daily. 30 tablet 0   Current Facility-Administered Medications on File Prior to Visit  Medication Dose Route Frequency Provider Last Rate Last Dose  . levalbuterol (XOPENEX) nebulizer solution 1.25 mg  1.25 mg Nebulization Once Jerrol Banana., MD   1.25 mg at 08/06/15 1610  . triamcinolone acetonide (KENALOG) 10 MG/ML injection 10 mg  10 mg Other Once Landis Martins, DPM        Allergies  Allergen Reactions  . Oxycodone Rash    Objective:  General: Alert and oriented x3 in no acute distress  Dermatology: No open lesions bilateral lower extremities, no webspace macerations, no ecchymosis bilateral, all nails x 10 are well manicured.  Vascular: Dorsalis Pedis and Posterior Tibial pedal pulses palpable, Capillary Fill Time 3 seconds,(+) pedal hair growth bilateral, no edema bilateral lower extremities, Temperature gradient within normal limits.  Neurology: Johney Maine sensation intact via light touch bilateral, Protective sensation intact  with Thornell Mule Monofilament to all pedal sites, Position sense intact, vibratory intact bilateral, Deep tendon reflexes within normal limits bilateral, No babinski sign present bilateral. (- )Tinels sign bilateral.   Musculoskeletal: No tenderness with palpation at right>left midfoot with mild exostosis and minimal pain with extensor tendons right>left,No pain with calf compression bilateral. Ankle and pedal range of motion within normal limits. Collapsing planus bilateral. Strength within normal limits in all groups bilateral.   Assessment and Plan: Problem List Items Addressed This Visit    None    Visit Diagnoses    Capsulitis of foot, unspecified laterality    -  Primary  Tendonitis of foot        Foot pain, unspecified laterality           -Complete examination performed -Discussed long term care -Cont Mobic until completed -Recommended good supportive  shoes and inserts; advised patient to lace shoes to avoid irritation to midfoot; advised custom inserts if OTC inserts fail to offer relief; Recommend and gave discount card for [employer] -Continue with activities and work-outs to tolerance -Patient to return to office as needed or sooner if condition worsens.  Landis Martins, DPM

## 2015-08-25 ENCOUNTER — Ambulatory Visit: Payer: 59 | Admitting: Family Medicine

## 2015-09-05 ENCOUNTER — Telehealth: Payer: Self-pay | Admitting: Family Medicine

## 2015-09-05 MED ORDER — AMOXICILLIN 500 MG PO CAPS: ORAL_CAPSULE | ORAL | Status: AC

## 2015-09-05 NOTE — Telephone Encounter (Signed)
Pt states she is having a cleaning appt on Monday at the dentist.  She has to get a RX for an antibiotic prior to having this done.  She uses [facility] in [place] patient   Her call back is [phone]  Thanks Con Memos

## 2015-09-05 NOTE — Telephone Encounter (Signed)
Please review. Thanks!  

## 2015-10-27 ENCOUNTER — Other Ambulatory Visit: Payer: Self-pay | Admitting: Family Medicine

## 2015-10-27 NOTE — Telephone Encounter (Signed)
Please review-aa 

## 2015-10-29 ENCOUNTER — Telehealth: Payer: Self-pay | Admitting: Family Medicine

## 2015-10-29 MED ORDER — ALPRAZOLAM 0.5 MG PO TABS: ORAL_TABLET | ORAL | 3 refills | Status: DC

## 2015-10-29 NOTE — Telephone Encounter (Signed)
Pt is requesting refill on generic Xanax 0.5.It is not listed in her med list but she states it was prescribed by you in Nov.She uses [facility] in [place]

## 2015-10-29 NOTE — Telephone Encounter (Signed)
RX called in-aa 

## 2015-10-29 NOTE — Telephone Encounter (Signed)
4 rf 

## 2015-11-21 ENCOUNTER — Encounter: Payer: Self-pay | Admitting: Obstetrics and Gynecology

## 2016-01-10 ENCOUNTER — Ambulatory Visit (INDEPENDENT_AMBULATORY_CARE_PROVIDER_SITE_OTHER): Payer: BLUE CROSS/BLUE SHIELD

## 2016-01-10 DIAGNOSIS — Z23 Encounter for immunization: Secondary | ICD-10-CM | POA: Diagnosis not present

## 2016-02-10 ENCOUNTER — Other Ambulatory Visit: Payer: Self-pay | Admitting: Obstetrics and Gynecology

## 2016-02-16 NOTE — Progress Notes (Signed)
Patient ID: Andrea Beard, female   DOB: 30-Jul-1955, 60 y.o.   MRN: [id number] ANNUAL PREVENTATIVE CARE GYN  ENCOUNTER NOTE  Subjective:       Andrea Beard is a 60 y.o. G3P2 female here for a routine annual gynecologic exam.  Current complaints:  1. Lack of sex drive- getting worse  2. Vagina; dryness- uses lubricates    Gynecologic History No LMP recorded. Patient has had a hysterectomy. Contraception: status post hysterectomyLAVH with anterior, posterior colporrhaphy Last Pap: n/a. Results were: normal Last mammogram: 02/2015 birad 1. Results were: normal Recurrent grade 2 cystocele, minimally symptomatic  Obstetric History OB History  Gravida Para Term Preterm AB Living  3 2          SAB TAB Ectopic Multiple Live Births               # Outcome Date GA Lbr Len/2nd Weight Sex Delivery Anes PTL Lv  3 Gravida           2 Para           1 Para               Past Medical History:  Diagnosis Date  . Anxiety   . Arthropathy   . Cancer ([place])    skin  . Depression   . H/O bilateral hip replacements   . Hypopotassemia   . Morbid obesity ([place])     Past Surgical History:  Procedure Laterality Date  . ABDOMINAL HYSTERECTOMY     Dr. Quenten Raven  . BREAST BIOPSY Right    negative  . Left Hip raplacement  Left 2008 and right 2010    Current Outpatient Prescriptions on File Prior to Visit  Medication Sig Dispense Refill  . albuterol (PROVENTIL HFA;VENTOLIN HFA) 108 (90 Base) MCG/ACT inhaler Inhale 1-2 puffs into the lungs every 4 (four) hours as needed for wheezing or shortness of breath. 1 Inhaler 2  . ALPRAZolam (XANAX) 0.5 MG tablet 1 to 2 tablets daily as needed 60 tablet 3  . azithromycin (ZITHROMAX) 250 MG tablet Take 2 tablets today, then take 1 tablet daily thereafter. 6 tablet 0  . meloxicam (MOBIC) 15 MG tablet Take 1 tablet (15 mg total) by mouth daily. 30 tablet 0   Current Facility-Administered Medications on File Prior to Visit  Medication Dose  Route Frequency Provider Last Rate Last Dose  . levalbuterol (XOPENEX) nebulizer solution 1.25 mg  1.25 mg Nebulization Once Jerrol Banana., MD      . triamcinolone acetonide (KENALOG) 10 MG/ML injection 10 mg  10 mg Other Once Landis Martins, DPM        Allergies  Allergen Reactions  . Oxycodone Rash    Social History   Social History  . Marital status: Married    Spouse name: N/A  . Number of children: N/A  . Years of education: N/A   Occupational History  . Not on file.   Social History Main Topics  . Smoking status: Never Smoker  . Smokeless tobacco: Never Used  . Alcohol use No     Comment: Non drinker/ No alcohol use.  . Drug use: No  . Sexual activity: Yes    Birth control/ protection: Surgical   Other Topics Concern  . Not on file   Social History Narrative  . No narrative on file    Family History  Problem Relation Age of Onset  . Hypertension Mother   . Cancer Mother  Breast  . Breast cancer Mother 37  . Heart disease Father   . Hypertension Father   . Arthritis Sister   . Thyroid disease Brother   . Arthritis Sister     arthritis with bilateral hip replacent and multiple myeloma disease  . Diabetes Paternal Grandfather   . Esophageal cancer Brother   . Stomach cancer Brother   . Melanoma Brother   . Bladder Cancer Brother     The following portions of the patient's history were reviewed and updated as appropriate: allergies, current medications, past family history, past medical history, past social history, past surgical history and problem list.  Review of Systems ROS Review of Systems - General ROS: negative for - chills, fatigue, fever, hot flashes, night sweats, weight gain or weight loss Psychological ROS: negative for - anxiety, decreased libido, depression, mood swings, physical abuse or sexual abuse Ophthalmic ROS: negative for - blurry vision, eye pain or loss of vision ENT ROS: negative for - headaches, hearing change,  visual changes or vocal changes Allergy and Immunology ROS: negative for - hives, itchy/watery eyes or seasonal allergies Hematological and Lymphatic ROS: negative for - bleeding problems, bruising, swollen lymph nodes or weight loss Endocrine ROS: negative for - galactorrhea, hair pattern changes, hot flashes, malaise/lethargy, mood swings, palpitations, polydipsia/polyuria, skin changes, temperature intolerance or unexpected weight changes Breast ROS: negative for - new or changing breast lumps or nipple discharge Respiratory ROS: negative for - cough or shortness of breath Cardiovascular ROS: negative for - chest pain, irregular heartbeat, palpitations or shortness of breath Gastrointestinal ROS: no abdominal pain, change in bowel habits, or black or bloody stools Genito-Urinary ROS: no dysuria, trouble voiding, or hematuria Musculoskeletal ROS: negative for - joint pain or joint stiffness Neurological ROS: negative for - bowel and bladder control changes Dermatological ROS: negative for rash and skin lesion changes   Objective:   BP 127/83   Pulse 71   Ht 5' 8" (1.727 m)   Wt 270 lb 9.6 oz (122.7 kg)   BMI 41.14 kg/m  CONSTITUTIONAL: Well-developed, well-nourished female in no acute distress.  PSYCHIATRIC: Normal mood and affect. Normal behavior. Normal judgment and thought content. [place]: Alert and oriented to person, place, and time. Normal muscle tone coordination. No cranial nerve deficit noted. HENT:  Normocephalic, atraumatic, External right and left ear normal. Oropharynx is clear and moist EYES: Conjunctivae and EOM are normal. Pupils are equal, round, and reactive to light. No scleral icterus.  NECK: Normal range of motion, supple, no masses.  Normal thyroid.  SKIN: Skin is warm and dry. No rash noted. Not diaphoretic. No erythema. No pallor. CARDIOVASCULAR: Normal heart rate noted, regular rhythm, no murmur. RESPIRATORY: Clear to auscultation bilaterally. Effort and  breath sounds normal, no problems with respiration noted. BREASTS: Symmetric in size. No masses, skin changes, nipple drainage, or lymphadenopathy. ABDOMEN: Soft, normal bowel sounds, no distention noted.  No tenderness, rebound or guarding.  BLADDER: Normal PELVIC:  External Genitalia: Normal  BUS: Normal  Vagina: Second-degree cystocele; good apex support; no rectocele  Cervix: Surgically absent  Uterus: Surgically absent  Adnexa: Normal  RV: External Exam NormaI, No Rectal Masses and Normal Sphincter tone  MUSCULOSKELETAL: Normal range of motion. No tenderness.  No cyanosis, clubbing, or edema.  2+ distal pulses. LYMPHATIC: No Axillary, Supraclavicular, or Inguinal Adenopathy.    Assessment:   Annual gynecologic examination 60 y.o. Contraception: status post hysterectomyLAVH with anterior, posterior colporrhaphy Obesity 1 Decreased libido. Recurrent second-degree cystocele, minimally symptomatic  Plan:  Pap:Not needed Mammogram: Ordered Stool Guaiac Testing: colonoscopy due soon 04/2016 Labs: thru pcp Routine preventative health maintenance measures emphasized: Exercise/Diet/Weight control, Tobacco Warnings and Alcohol/Substance use risks Calcium with vitamin D supplementation.  Encouraged Exercise and weight loss goal of 1 pound per month.  Encouraged Sex hormone binding globulin ordered Free and  total testosterone ordered Testosterone cypionate injection monthly 3 (1/2-1 mg/kg per injection)-100 mg per injection Follow-up monthly 3 with the injections Return to [place], CMA  Brayton Mars, MD   Note: This dictation was prepared with Dragon dictation along with smaller phrase technology. Any transcriptional errors that result from this process are unintentional.

## 2016-02-17 ENCOUNTER — Ambulatory Visit (INDEPENDENT_AMBULATORY_CARE_PROVIDER_SITE_OTHER): Payer: BLUE CROSS/BLUE SHIELD | Admitting: Obstetrics and Gynecology

## 2016-02-17 ENCOUNTER — Ambulatory Visit: Payer: BLUE CROSS/BLUE SHIELD | Admitting: Sports Medicine

## 2016-02-17 ENCOUNTER — Encounter: Payer: Self-pay | Admitting: Obstetrics and Gynecology

## 2016-02-17 VITALS — BP 127/83 | HR 71 | Ht 68.0 in | Wt 270.6 lb

## 2016-02-17 DIAGNOSIS — E6609 Other obesity due to excess calories: Secondary | ICD-10-CM | POA: Diagnosis not present

## 2016-02-17 DIAGNOSIS — Z1231 Encounter for screening mammogram for malignant neoplasm of breast: Secondary | ICD-10-CM

## 2016-02-17 DIAGNOSIS — Z01419 Encounter for gynecological examination (general) (routine) without abnormal findings: Secondary | ICD-10-CM

## 2016-02-17 DIAGNOSIS — Z90710 Acquired absence of both cervix and uterus: Secondary | ICD-10-CM | POA: Diagnosis not present

## 2016-02-17 DIAGNOSIS — Z6841 Body Mass Index (BMI) 40.0 and over, adult: Secondary | ICD-10-CM | POA: Diagnosis not present

## 2016-02-17 DIAGNOSIS — N898 Other specified noninflammatory disorders of vagina: Secondary | ICD-10-CM | POA: Diagnosis not present

## 2016-02-17 DIAGNOSIS — Z1211 Encounter for screening for malignant neoplasm of colon: Secondary | ICD-10-CM

## 2016-02-17 DIAGNOSIS — IMO0001 Reserved for inherently not codable concepts without codable children: Secondary | ICD-10-CM

## 2016-02-17 DIAGNOSIS — R6882 Decreased libido: Secondary | ICD-10-CM

## 2016-02-17 NOTE — Patient Instructions (Signed)

## 2016-02-19 LAB — TESTOSTERONE, FREE, TOTAL, SHBG
SEX HORMONE BINDING: 45.9 nmol/L (ref 17.3–125.0)
TESTOSTERONE FREE: 0.7 pg/mL (ref 0.0–4.2)
Testosterone: <3 ng/dL — ABNORMAL LOW (ref 3–41)

## 2016-02-20 ENCOUNTER — Encounter: Payer: Self-pay | Admitting: Obstetrics and Gynecology

## 2016-02-24 ENCOUNTER — Ambulatory Visit: Payer: BLUE CROSS/BLUE SHIELD | Admitting: Sports Medicine

## 2016-03-04 ENCOUNTER — Encounter: Payer: Self-pay | Admitting: Obstetrics and Gynecology

## 2016-03-04 ENCOUNTER — Encounter: Payer: BLUE CROSS/BLUE SHIELD | Admitting: Family Medicine

## 2016-03-04 MED ORDER — TESTOSTERONE CYPIONATE 100 MG/ML IM SOLN: 100.0000 mg | INTRAMUSCULAR | 0 refills | Status: DC

## 2016-03-04 NOTE — Addendum Note (Signed)
Addended by: Elouise Munroe on: 03/04/2016 11:25 AM   Modules accepted: Orders

## 2016-03-08 ENCOUNTER — Ambulatory Visit (INDEPENDENT_AMBULATORY_CARE_PROVIDER_SITE_OTHER): Payer: BLUE CROSS/BLUE SHIELD | Admitting: Obstetrics and Gynecology

## 2016-03-08 VITALS — BP 143/93 | HR 58 | Ht 67.0 in | Wt 271.1 lb

## 2016-03-08 DIAGNOSIS — R6882 Decreased libido: Secondary | ICD-10-CM

## 2016-03-08 MED ORDER — TESTOSTERONE CYPIONATE 200 MG/ML IM SOLN
200.0000 mg | INTRAMUSCULAR | Status: DC
  Administered 2016-03-08: 200 mg via INTRAMUSCULAR

## 2016-03-08 NOTE — Progress Notes (Signed)
Patient ID: Andrea Beard, female   DOB: 1956-02-13, 60 y.o.   MRN: [id number]  Pt presents for 1st Testosterone injection. VIS sheet printed to AVS for pt.  Weight: 271.1 lbs. Pt given 0.90ml. (Testosterone 200mg /ml)

## 2016-03-08 NOTE — Patient Instructions (Signed)
Testosterone injection What is this medicine? TESTOSTERONE (tes TOS ter one) is the main female hormone. It supports normal female development such as muscle growth, facial hair, and deep voice. It is used in males to treat low testosterone levels. This medicine may be used for other purposes; ask your health care provider or pharmacist if you have questions. COMMON BRAND NAME(S): Andro-L.A., Aveed, Delatestryl, Depo-Testosterone, Virilon What should I tell my health care provider before I take this medicine? They need to know if you have any of these conditions: -cancer -diabetes -heart disease -kidney disease -liver disease -lung disease -prostate disease -an unusual or allergic reaction to testosterone, other medicines, foods, dyes, or preservatives -pregnant or trying to get pregnant -breast-feeding How should I use this medicine? This medicine is for injection into a muscle. It is usually given by a health care professional in a hospital or clinic setting. Contact your pediatrician regarding the use of this medicine in children. While this medicine may be prescribed for children as young as 12 years of age for selected conditions, precautions do apply. Overdosage: If you think you have taken too much of this medicine contact a poison control center or emergency room at once. NOTE: This medicine is only for you. Do not share this medicine with others. What if I miss a dose? Try not to miss a dose. Your doctor or health care professional will tell you when your next injection is due. Notify the office if you are unable to keep an appointment. What may interact with this medicine? -medicines for diabetes -medicines that treat or prevent blood clots like warfarin -oxyphenbutazone -propranolol -steroid medicines like prednisone or cortisone This list may not describe all possible interactions. Give your health care provider a list of all the medicines, herbs, non-prescription drugs, or  dietary supplements you use. Also tell them if you smoke, drink alcohol, or use illegal drugs. Some items may interact with your medicine. What should I watch for while using this medicine? Visit your doctor or health care professional for regular checks on your progress. They will need to check the level of testosterone in your blood. This medicine is only approved for use in men who have low levels of testosterone related to certain medical conditions. Heart attacks and strokes have been reported with the use of this medicine. Notify your doctor or health care professional and seek emergency treatment if you develop breathing problems; changes in vision; confusion; chest pain or chest tightness; sudden arm pain; severe, sudden headache; trouble speaking or understanding; sudden numbness or weakness of the face, arm or leg; loss of balance or coordination. Talk to your doctor about the risks and benefits of this medicine. This medicine may affect blood sugar levels. If you have diabetes, check with your doctor or health care professional before you change your diet or the dose of your diabetic medicine. Testosterone injections are not commonly used in women. Women should inform their doctor if they wish to become pregnant or think they might be pregnant. There is a potential for serious side effects to an unborn child. Talk to your health care professional or pharmacist for more information. Talk with your doctor or health care professional about your birth control options while taking this medicine. This drug is banned from use in athletes by most athletic organizations. What side effects may I notice from receiving this medicine? Side effects that you should report to your doctor or health care professional as soon as possible: -allergic reactions like skin rash,   itching or hives, swelling of the face, lips, or tongue -breast enlargement -breathing problems -changes in emotions or moods -deep or  hoarse voice -irregular menstrual periods -signs and symptoms of liver injury like dark yellow or brown urine; general ill feeling or flu-like symptoms; light-colored stools; loss of appetite; nausea; right upper belly pain; unusually weak or tired; yellowing of the eyes or skin -stomach pain -swelling of the ankles, feet, hands -too frequent or persistent erections -trouble passing urine or change in the amount of urine Side effects that usually do not require medical attention (report to your doctor or health care professional if they continue or are bothersome): -acne -change in sex drive or performance -facial hair growth -hair loss -headache This list may not describe all possible side effects. Call your doctor for medical advice about side effects. You may report side effects to FDA at 1-800-FDA-1088. Where should I keep my medicine? Keep out of the reach of children. This medicine can be abused. Keep your medicine in a safe place to protect it from theft. Do not share this medicine with anyone. Selling or giving away this medicine is dangerous and against the law. Store at room temperature between 20 and 25 degrees C (68 and 77 degrees F). Do not freeze. Protect from light. Follow the directions for the product you are prescribed. Throw away any unused medicine after the expiration date. NOTE: This sheet is a summary. It may not cover all possible information. If you have questions about this medicine, talk to your doctor, pharmacist, or health care provider.  2017 Elsevier/Gold Standard (2015-04-26 07:33:55)  

## 2016-04-02 ENCOUNTER — Ambulatory Visit
Admission: RE | Admit: 2016-04-02 | Discharge: 2016-04-02 | Disposition: A | Discharge status: Home / Self Care | Payer: BLUE CROSS/BLUE SHIELD | Source: Ambulatory Visit | Attending: Obstetrics and Gynecology | Admitting: Obstetrics and Gynecology

## 2016-04-02 DIAGNOSIS — Z1231 Encounter for screening mammogram for malignant neoplasm of breast: Secondary | ICD-10-CM

## 2016-04-06 ENCOUNTER — Encounter: Payer: Self-pay | Admitting: Obstetrics and Gynecology

## 2016-04-06 ENCOUNTER — Ambulatory Visit (INDEPENDENT_AMBULATORY_CARE_PROVIDER_SITE_OTHER): Payer: BLUE CROSS/BLUE SHIELD | Admitting: Obstetrics and Gynecology

## 2016-04-06 VITALS — BP 138/83 | HR 76 | Ht 67.0 in | Wt 279.1 lb

## 2016-04-06 DIAGNOSIS — E349 Endocrine disorder, unspecified: Secondary | ICD-10-CM

## 2016-04-06 DIAGNOSIS — R7989 Other specified abnormal findings of blood chemistry: Secondary | ICD-10-CM

## 2016-04-06 DIAGNOSIS — N898 Other specified noninflammatory disorders of vagina: Secondary | ICD-10-CM

## 2016-04-06 DIAGNOSIS — N951 Menopausal and female climacteric states: Secondary | ICD-10-CM | POA: Diagnosis not present

## 2016-04-06 DIAGNOSIS — Z90710 Acquired absence of both cervix and uterus: Secondary | ICD-10-CM | POA: Diagnosis not present

## 2016-04-06 DIAGNOSIS — R6882 Decreased libido: Secondary | ICD-10-CM | POA: Diagnosis not present

## 2016-04-06 MED ORDER — TESTOSTERONE CYPIONATE 200 MG/ML IM SOLN
100.0000 mg | INTRAMUSCULAR | Status: DC
  Administered 2016-04-06: 75 mg via INTRAMUSCULAR

## 2016-04-06 NOTE — Patient Instructions (Addendum)
1. Second injection of testosterone cypionate is given (75 mg intramuscular) 2. Return in 4 weeks for 3rd injection and follow-up

## 2016-04-06 NOTE — Progress Notes (Signed)
Chief complaint: 1. Decreased libido  Andrea Beard presents today for follow-up one month after her first injection of testosterone cypionate for decreased libido. Laboratory testing prior to injection revealed very low testosterone level.  Patient has noted marked improvement in symptomatology. Sex [address] fantasy thought, orgasm quality and frequency improved. Patient has noticed some increased irritability and hypersensitivity of pelvis  OBJECTIVE: BP 138/83   Pulse 76   Ht 5\' 7"  (1.702 m)   Wt 279 lb 1.6 oz (126.6 kg)   BMI 43.71 kg/m  Physical exam-deferred  ASSESSMENT: 1. Decreased libido secondary to low testosterone 2. Excellent response to intramuscular testosterone cypionate, but with some mild side effects  PLAN: 1. Decrease testosterone cypionate injection from 100 mg to 75 mg. 2. Monitor symptomatology 3. Return in 4 weeks for follow-up and 3rd injection  A total of 15 minutes were spent face-to-face with the patient during this encounter and over half of that time dealt with counseling and coordination of care.  Brayton Mars, MD  Note: This dictation was prepared with Dragon dictation along with smaller phrase technology. Any transcriptional errors that result from this process are unintentional.

## 2016-04-08 ENCOUNTER — Ambulatory Visit (INDEPENDENT_AMBULATORY_CARE_PROVIDER_SITE_OTHER): Payer: BLUE CROSS/BLUE SHIELD | Admitting: Family Medicine

## 2016-04-08 ENCOUNTER — Encounter: Payer: Self-pay | Admitting: Family Medicine

## 2016-04-08 VITALS — BP 128/78 | HR 82 | Temp 97.9°F | Ht 67.0 in | Wt 278.0 lb

## 2016-04-08 DIAGNOSIS — R05 Cough: Secondary | ICD-10-CM | POA: Diagnosis not present

## 2016-04-08 DIAGNOSIS — R739 Hyperglycemia, unspecified: Secondary | ICD-10-CM | POA: Diagnosis not present

## 2016-04-08 DIAGNOSIS — Z0000 Encounter for general adult medical examination without abnormal findings: Secondary | ICD-10-CM

## 2016-04-08 DIAGNOSIS — R059 Cough, unspecified: Secondary | ICD-10-CM

## 2016-04-08 NOTE — Progress Notes (Signed)
Patient: Andrea Beard, Female    DOB: May 01, 1955, 61 y.o.   MRN: [id number] Visit Date: 04/08/2016  Today's Provider: Wilhemena Durie, MD   Chief Complaint  Patient presents with  . Annual Exam   Subjective:    Annual physical exam Andrea Beard is a 61 y.o. female who presents today for health maintenance and complete physical. She feels well. She reports she is not exercising right now. She reports she is sleeping fairly well.  ----------------------------------------------------------------- Colonoscopy and endoscopy- 03/24/11 repeat 5 years Mammogram- 04/02/16 Pap- Dr. Keturah Barre  Review of Systems  Constitutional: Negative.   HENT: Negative.   Eyes: Negative.   Respiratory: Positive for cough.        Patient has had a nonproductive cough since November that is lingering but improving.  Cardiovascular: Negative.   Gastrointestinal: Positive for abdominal distention.  Endocrine: Negative.   Genitourinary: Negative.   Musculoskeletal: Positive for arthralgias and neck stiffness.  Skin: Negative.   Allergic/Immunologic: Negative.   Neurological: Negative.   Hematological: Negative.   Psychiatric/Behavioral: Positive for agitation. The patient is nervous/anxious.     Social History      She  reports that she has never smoked. She has never used smokeless tobacco. She reports that she does not drink alcohol or use drugs.       Social History   Social History  . Marital status: Married    Spouse name: N/A  . Number of children: N/A  . Years of education: N/A   Social History Main Topics  . Smoking status: Never Smoker  . Smokeless tobacco: Never Used  . Alcohol use No     Comment: Non drinker/ No alcohol use.  . Drug use: No  . Sexual activity: Yes    Birth control/ protection: Surgical   Other Topics Concern  . None   Social History Narrative  . None    Past Medical History:  Diagnosis Date  . Anxiety   . Arthritis   . Arthropathy   .  Cancer ([place])    skin  . Depression   . H/O bilateral hip replacements   . Hypopotassemia   . Morbid obesity [facility])      Patient Active Problem List   Diagnosis Date Noted  . Chest pain 06/24/2015  . Combined fat and carbohydrate induced hyperlipemia 06/24/2015  . MI (mitral incompetence) 06/24/2015  . 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 Cystocele 02/12/2015  . Status post laparoscopic assisted vaginal hysterectomy (LAVH) 02/12/2015  . Adenomyosis 02/12/2015  . Abdominal pain, left upper quadrant 11/22/2014  . Abnormal blood level of cobalt 11/22/2014  . Anxiety 11/22/2014  . Arthropathia 11/22/2014  . Decreased libido 11/22/2014  . Clinical depression 11/22/2014  . Fibroid 11/22/2014  . Borderline diabetes 11/22/2014  . History of repair of hip joint 11/22/2014  . Cardiac murmur 11/22/2014  . Titanium poisoning 11/22/2014  . Arthralgia of hip 11/22/2014  . Decreased potassium in the blood 11/22/2014  . Asymptomatic postmenopausal status 11/22/2014  . Extreme obesity ([place]) 11/22/2014  . Cramp of limb 11/22/2014  . Bacterial upper respiratory infection 11/22/2014  . Screening examination for poliomyelitis 11/22/2014  . Foot tendinitis 11/22/2014  . Head revolving around 11/22/2014  . Menopause 11/22/2014  . Idiopathic localized osteoarthropathy 10/02/2012    Past Surgical History:  Procedure Laterality Date  . ABDOMINAL HYSTERECTOMY     Dr. Quenten Raven  . BREAST BIOPSY Right    negative  . Left Hip raplacement  Left 2008 and right 2010    Family History        Family Status  Relation Status  . Mother Alive  . Father Alive   MI in 2011 with stint palcement  . Sister Alive   Arthritis with bilatera hip replacement and basal cell carcinoma  . Brother Alive  . Sister Alive  . Brother Deceased at age 6   esophageal and stomach cancer with metz  . Brother Alive   Melanoma and bladder cancer  . Brother Alive   Obese  . Paternal Grandfather         Her family  history includes Arthritis in her sister and sister; Bladder Cancer in her brother; Breast cancer (age of onset: 61) in her mother; Cancer in her mother; Diabetes in her paternal grandfather; Esophageal cancer in her brother; Heart disease in her father; Hypertension in her father and mother; Melanoma in her brother; Stomach cancer in her brother; Thyroid disease in her brother.     Allergies  Allergen Reactions  . Oxycodone Rash     Current Outpatient Prescriptions:  .  ALPRAZolam (XANAX) 0.5 MG tablet, 1 to 2 tablets daily as needed, Disp: 60 tablet, Rfl: 3 .  testosterone cypionate (DEPOTESTOTERONE CYPIONATE) 100 MG/ML injection, Inject 1 mL (100 mg total) into the muscle every 28 (twenty-eight) days. For IM use only, Disp: 10 mL, Rfl: 0  Current Facility-Administered Medications:  .  testosterone cypionate (DEPOTESTOSTERONE CYPIONATE) injection 100 mg, 100 mg, Intramuscular, Q28 days, Brayton Mars, MD, 75 mg at 04/06/16 1343 .  testosterone cypionate (DEPOTESTOSTERONE CYPIONATE) injection 200 mg, 200 mg, Intramuscular, Q28 days, Brayton Mars, MD, 200 mg at 03/08/16 1147   Patient Care Team: Jerrol Banana., MD as PCP - General (Family Medicine)      Objective:   Vitals: BP 128/78 (BP Location: Left Arm, Patient Position: Sitting, Cuff Size: Large)   Pulse 82   Temp 97.9 F (36.6 C) (Oral)   Ht 5\' 7"  (1.702 m)   Wt 278 lb (126.1 kg)   BMI 43.54 kg/m    Physical Exam  Constitutional: She is oriented to person, place, and time. She appears well-developed and well-nourished.  HENT:  Head: Normocephalic and atraumatic.  Right Ear: External ear normal.  Left Ear: External ear normal.  Nose: Nose normal.  Mouth/Throat: Oropharynx is clear and moist.  Eyes: Conjunctivae and EOM are normal. Pupils are equal, round, and reactive to light.  Neck: Normal range of motion. Neck supple.  Cardiovascular: Normal rate, regular rhythm, normal heart sounds and intact  distal pulses.   Pulmonary/Chest: Effort normal and breath sounds normal.  Abdominal: Soft. Bowel sounds are normal.  Musculoskeletal: Normal range of motion.  Neurological: She is alert and oriented to person, place, and time. She has normal reflexes.  Skin: Skin is warm and dry.  Psychiatric: She has a normal mood and affect. Her behavior is normal. Judgment and thought content normal.     Depression Screen PHQ 2/9 Scores 04/08/2016 02/25/2015  PHQ - 2 Score 0 0      Assessment & Plan:     Routine Health Maintenance and Physical Exam  Exercise Activities and Dietary recommendations Goals    None       Immunization History  Administered Date(s) Administered  . Influenza,inj,Quad PF,36+ Mos 01/10/2015, 01/10/2016  . Influenza-Unspecified 01/11/2013, 01/12/2014  . Tdap 06/29/2007    Health Maintenance  Topic Date Due  . Hepatitis C Screening  1956-02-26  .  HIV Screening  03/20/1973  . PAP SMEAR  10/27/2013  . TETANUS/TDAP  06/28/2017  . MAMMOGRAM  04/02/2018  . COLONOSCOPY  03/23/2021  . INFLUENZA VACCINE  Completed    Breasts exam, pelvic exam, rectal exam per GYN. Discussed health benefits of physical activity, and encouraged her to engage in regular exercise appropriate for her age and condition.  Cough If this persists discussed with patient we would obtain chest x-ray and maybe treat with Z-Pak although I think it is resolving on its own. Obesity Patient admits to being a binge eater. We discussed this at length. Also discussed regular exercise. Decreased libido being treated with testosterone injections by GYN.  I have done the exam and reviewed the chart and it is accurate to the best of my knowledge. Development worker, community has been used and  any errors in dictation or transcription are unintentional. Miguel Aschoff M.D. [facility]     --------------------------------------------------------------------    Wilhemena Durie, MD  [facility]

## 2016-04-13 ENCOUNTER — Telehealth: Payer: Self-pay

## 2016-04-13 ENCOUNTER — Encounter: Payer: Self-pay | Admitting: Obstetrics and Gynecology

## 2016-04-13 LAB — COMPREHENSIVE METABOLIC PANEL
ALBUMIN: 4.1 g/dL (ref 3.5–5.5)
ALT: 17 IU/L (ref 0–32)
AST: 14 IU/L (ref 0–40)
Albumin/Globulin Ratio: 1.4 (ref 1.2–2.2)
Alkaline Phosphatase: 102 IU/L (ref 39–117)
BUN / CREAT RATIO: 27 — AB (ref 9–23)
BUN: 16 mg/dL (ref 6–24)
Bilirubin Total: 0.4 mg/dL (ref 0.0–1.2)
CALCIUM: 9.9 mg/dL (ref 8.7–10.2)
CO2: 27 mmol/L (ref 18–29)
Chloride: 100 mmol/L (ref 96–106)
Creatinine, Ser: 0.6 mg/dL (ref 0.57–1.00)
GFR, EST AFRICAN AMERICAN: 116 mL/min/{1.73_m2} (ref >59)
GFR, EST NON AFRICAN AMERICAN: 101 mL/min/{1.73_m2} (ref >59)
GLUCOSE: 86 mg/dL (ref 65–99)
Globulin, Total: 3 g/dL (ref 1.5–4.5)
Potassium: 4.6 mmol/L (ref 3.5–5.2)
Sodium: 141 mmol/L (ref 134–144)
Total Protein: 7.1 g/dL (ref 6.0–8.5)

## 2016-04-13 LAB — TSH: TSH: 2 u[IU]/mL (ref 0.450–4.500)

## 2016-04-13 LAB — CBC WITH DIFFERENTIAL/PLATELET
BASOS ABS: 0 10*3/uL (ref 0.0–0.2)
BASOS: 0 %
EOS (ABSOLUTE): 0.2 10*3/uL (ref 0.0–0.4)
Eos: 2 %
Hematocrit: 41.4 % (ref 34.0–46.6)
Hemoglobin: 13.6 g/dL (ref 11.1–15.9)
IMMATURE GRANS (ABS): 0 10*3/uL (ref 0.0–0.1)
IMMATURE GRANULOCYTES: 0 %
LYMPHS: 34 %
Lymphocytes Absolute: 2.5 10*3/uL (ref 0.7–3.1)
MCH: 29.5 pg (ref 26.6–33.0)
MCHC: 32.9 g/dL (ref 31.5–35.7)
MCV: 90 fL (ref 79–97)
MONOS ABS: 0.6 10*3/uL (ref 0.1–0.9)
Monocytes: 8 %
Neutrophils Absolute: 4.1 10*3/uL (ref 1.4–7.0)
Neutrophils: 56 %
Platelets: 350 10*3/uL (ref 150–379)
RBC: 4.61 x10E6/uL (ref 3.77–5.28)
RDW: 14.8 % (ref 12.3–15.4)
WBC: 7.4 10*3/uL (ref 3.4–10.8)

## 2016-04-13 LAB — LIPID PANEL WITH LDL/HDL RATIO
CHOLESTEROL TOTAL: 195 mg/dL (ref 100–199)
HDL: 57 mg/dL (ref >39)
LDL Calculated: 120 mg/dL — ABNORMAL HIGH (ref 0–99)
LDl/HDL Ratio: 2.1 ratio units (ref 0.0–3.2)
Triglycerides: 88 mg/dL (ref 0–149)
VLDL CHOLESTEROL CAL: 18 mg/dL (ref 5–40)

## 2016-04-13 LAB — HEMOGLOBIN A1C
Est. average glucose Bld gHb Est-mCnc: 111 mg/dL
Hgb A1c MFr Bld: 5.5 % (ref 4.8–5.6)

## 2016-04-13 NOTE — Telephone Encounter (Signed)
-----   Message from Jerrol Banana., MD sent at 04/13/2016  2:33 PM EST ----- Labs okay.

## 2016-04-13 NOTE — Telephone Encounter (Signed)
Advised pt of lab results. Pt verbally acknowledges understanding. Emily Drozdowski, CMA   

## 2016-04-26 ENCOUNTER — Encounter: Payer: Self-pay | Admitting: Family Medicine

## 2016-05-04 ENCOUNTER — Encounter: Payer: Self-pay | Admitting: Obstetrics and Gynecology

## 2016-05-04 ENCOUNTER — Encounter: Payer: Self-pay | Admitting: Family Medicine

## 2016-05-04 ENCOUNTER — Ambulatory Visit (INDEPENDENT_AMBULATORY_CARE_PROVIDER_SITE_OTHER): Payer: BLUE CROSS/BLUE SHIELD | Admitting: Obstetrics and Gynecology

## 2016-05-04 ENCOUNTER — Telehealth: Payer: Self-pay

## 2016-05-04 VITALS — BP 137/83 | HR 83 | Ht 67.0 in | Wt 275.5 lb

## 2016-05-04 DIAGNOSIS — R6882 Decreased libido: Secondary | ICD-10-CM | POA: Diagnosis not present

## 2016-05-04 DIAGNOSIS — E349 Endocrine disorder, unspecified: Secondary | ICD-10-CM | POA: Diagnosis not present

## 2016-05-04 DIAGNOSIS — R7989 Other specified abnormal findings of blood chemistry: Secondary | ICD-10-CM

## 2016-05-04 MED ORDER — TESTOSTERONE CYPIONATE 200 MG/ML IM SOLN
100.0000 mg | INTRAMUSCULAR | Status: DC
  Administered 2016-05-04: 75 mg via INTRAMUSCULAR

## 2016-05-04 NOTE — Progress Notes (Signed)
Chief complaint: 1. Decreased libido  Patient presents for follow-up after her second injection of testosterone cypionate. The second dose was a lower dosage and she did not have any significant side effects on this dosing regimen. He still had excellent response mass with extensive, orgasm, orgasm quality, and lubrication.   OBJECTIVE: BP 137/83   Pulse 83   Ht 5\' 7"  (1.702 m)   Wt 275 lb 8 oz (125 kg)   BMI 43.15 kg/m  Physical exam-deferred  ASSESSMENT: 1. Decreased libido 2. Low testosterone 3. Excellent response to intramuscular testosterone cypionate  PLAN: 1. Third dose of testosterone cypionate 75 mg muscular 2. Return in 4 weeks for follow-up and probable transition to compounded testosterone cream  A total of 15 minutes were spent face-to-face with the patient during this encounter and over half of that time dealt with counseling and coordination of care.  Brayton Mars, MD

## 2016-05-04 NOTE — Telephone Encounter (Signed)
Error

## 2016-05-04 NOTE — Patient Instructions (Signed)
1. Return in 4 weeks for follow-up

## 2016-05-26 ENCOUNTER — Encounter: Payer: Self-pay | Admitting: Obstetrics and Gynecology

## 2016-06-01 ENCOUNTER — Encounter: Payer: BLUE CROSS/BLUE SHIELD | Admitting: Obstetrics and Gynecology

## 2016-06-09 ENCOUNTER — Encounter: Payer: Self-pay | Admitting: Obstetrics and Gynecology

## 2016-06-16 ENCOUNTER — Encounter: Payer: BLUE CROSS/BLUE SHIELD | Admitting: Obstetrics and Gynecology

## 2016-06-22 ENCOUNTER — Encounter: Payer: Self-pay | Admitting: Obstetrics and Gynecology

## 2016-06-23 ENCOUNTER — Encounter: Payer: BLUE CROSS/BLUE SHIELD | Admitting: Obstetrics and Gynecology

## 2016-06-24 ENCOUNTER — Encounter: Payer: Self-pay | Admitting: Family Medicine

## 2016-06-25 ENCOUNTER — Other Ambulatory Visit: Payer: Self-pay

## 2016-06-25 NOTE — Telephone Encounter (Signed)
Refill request from Patient. She has been having anxiety her father passed away in Jun 15, 2022 and then mothers health. Please review-aa

## 2016-06-28 MED ORDER — ALPRAZOLAM 0.5 MG PO TABS
ORAL_TABLET | ORAL | 3 refills | Status: DC
Start: 2016-06-28 — End: 2017-04-12

## 2016-06-30 ENCOUNTER — Encounter: Payer: Self-pay | Admitting: Obstetrics and Gynecology

## 2016-06-30 ENCOUNTER — Ambulatory Visit (INDEPENDENT_AMBULATORY_CARE_PROVIDER_SITE_OTHER): Payer: BLUE CROSS/BLUE SHIELD | Admitting: Obstetrics and Gynecology

## 2016-06-30 VITALS — BP 135/82 | HR 75 | Ht 67.0 in | Wt 280.7 lb

## 2016-06-30 DIAGNOSIS — E349 Endocrine disorder, unspecified: Secondary | ICD-10-CM

## 2016-06-30 DIAGNOSIS — R7989 Other specified abnormal findings of blood chemistry: Secondary | ICD-10-CM

## 2016-06-30 DIAGNOSIS — Z79899 Other long term (current) drug therapy: Secondary | ICD-10-CM

## 2016-06-30 DIAGNOSIS — R6882 Decreased libido: Secondary | ICD-10-CM

## 2016-06-30 NOTE — Patient Instructions (Signed)
1. Apply testosterone cream 4% topically to the mons pubis and clitoris as directed every other day. If sensitivity occurs, alternates application between mons pubis/clitoris and wrist. 2. Return in 3 months for follow-up

## 2016-06-30 NOTE — Progress Notes (Signed)
Chief complaint: 1. Decreased libido 2. Low testosterone levels 3. Medication management  Shaleka presents today for interval follow-up. Her last injection of intramuscular testosterone cypionate was over 2 months ago. She continues to note some beneficial effects of the medication. She did have some concerns about possible weaning side effects including fatigue. Patient recently lost her father 5 weeks ago and had to take care of her birth mom for 5 weeks due to elderly ailmments  Samarie has not started the topical testosterone ointment as of yet.  Past medical history, past surgical history, problem list, medications, and allergies are reviewed  OBJECTIVE: BP 135/82   Pulse 75   Ht 5\' 7"  (1.702 m)   Wt 280 lb 11.2 oz (127.3 kg)   BMI 43.96 kg/m  Physical exam-deferred  ASSESSMENT: 1. History of decreased libido due to low testosterone levels 2. History of beneficial effect of IM testosterone for improving libido 3. Conversion to topical compounded estrogen cream has not begun. 4. Recent loss of father from an MI  PLAN: 1. Multiple questions regarding testosterone side effects were reviewed 2. Patient is encouraged to begin testosterone ointment in HRT cream to be applied topically to the mons pubis/clitoris every other day; should she have acute sensitivity of the clitoris, she is to alternate the application of the [place] between the mons/clitoris and wrist on an every other day basis. 3. Return in 3 months for follow-up 4. Grieving response discussed  A total of 15 minutes were spent face-to-face with the patient during this encounter and over half of that time dealt with counseling and coordination of care.  Brayton Mars, MD  Note: This dictation was prepared with Dragon dictation along with smaller phrase technology. Any transcriptional errors that result from this process are unintentional.

## 2016-07-01 ENCOUNTER — Telehealth: Payer: Self-pay | Admitting: Obstetrics and Gynecology

## 2016-07-01 NOTE — Telephone Encounter (Signed)
Andrea Beard from [facility] aware directions are only pea size amount.

## 2016-07-05 ENCOUNTER — Ambulatory Visit (INDEPENDENT_AMBULATORY_CARE_PROVIDER_SITE_OTHER): Payer: BLUE CROSS/BLUE SHIELD | Admitting: Family Medicine

## 2016-07-05 ENCOUNTER — Encounter: Payer: Self-pay | Admitting: Family Medicine

## 2016-07-05 VITALS — BP 118/80 | HR 82 | Temp 98.2°F | Resp 16 | Wt 283.0 lb

## 2016-07-05 DIAGNOSIS — K219 Gastro-esophageal reflux disease without esophagitis: Secondary | ICD-10-CM | POA: Diagnosis not present

## 2016-07-05 DIAGNOSIS — R079 Chest pain, unspecified: Secondary | ICD-10-CM

## 2016-07-05 DIAGNOSIS — R0789 Other chest pain: Secondary | ICD-10-CM | POA: Diagnosis not present

## 2016-07-05 MED ORDER — ASPIRIN EC 81 MG PO TBEC: 81.0000 mg | DELAYED_RELEASE_TABLET | Freq: Every day | ORAL | 12 refills | Status: AC

## 2016-07-05 MED ORDER — RANITIDINE HCL 150 MG PO TABS: 150.0000 mg | ORAL_TABLET | Freq: Two times a day (BID) | ORAL | 12 refills | Status: DC

## 2016-07-05 NOTE — Progress Notes (Signed)
Subjective:  HPI Pt is her today for chest/shoulder pain. She reports that she notices it much more when she is under a lot of stress the last couple of months. It has been occurring intermittently for years but has been more constant lately. She has seen cardiology in the past for "leaky valves". She reports that sometimes the pain will radiate through to her back. Denies any worse shortness of breath, dizziness, sweats, weakness, or nausea. She has been grieving the loss of her father and dealing with a mother that is ill, so she has been under a lot of stress.   Prior to Admission medications   Medication Sig Start Date End Date Taking? Authorizing Provider  ALPRAZolam Duanne Moron) 0.5 MG tablet 1 to 2 tablets daily as needed 06/28/16   Jerrol Banana., MD    Patient Active Problem List   Diagnosis Date Noted  . Chest pain 06/24/2015  . Combined fat and carbohydrate induced hyperlipemia 06/24/2015  . MI (mitral incompetence) 06/24/2015  . 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 Cystocele 02/12/2015  . Status post laparoscopic assisted vaginal hysterectomy (LAVH) 02/12/2015  . Adenomyosis 02/12/2015  . Abdominal pain, left upper quadrant 11/22/2014  . Abnormal blood level of cobalt 11/22/2014  . Anxiety 11/22/2014  . Arthropathia 11/22/2014  . Decreased libido 11/22/2014  . Clinical depression 11/22/2014  . Fibroid 11/22/2014  . Borderline diabetes 11/22/2014  . History of repair of hip joint 11/22/2014  . Cardiac murmur 11/22/2014  . Titanium poisoning 11/22/2014  . Arthralgia of hip 11/22/2014  . Decreased potassium in the blood 11/22/2014  . Asymptomatic postmenopausal status 11/22/2014  . Extreme obesity ([place]) 11/22/2014  . Cramp of limb 11/22/2014  . Bacterial upper respiratory infection 11/22/2014  . Screening examination for poliomyelitis 11/22/2014  . Foot tendinitis 11/22/2014  . Head revolving around 11/22/2014  . Menopause 11/22/2014  . Idiopathic localized  osteoarthropathy 10/02/2012    Past Medical History:  Diagnosis Date  . Anxiety   . Arthritis   . Arthropathy   . Cancer ([place])    skin  . Depression   . H/O bilateral hip replacements   . Hypopotassemia   . Morbid obesity ([place])     Social History   Social History  . Marital status: Married    Spouse name: N/A  . Number of children: N/A  . Years of education: N/A   Occupational History  . Not on file.   Social History Main Topics  . Smoking status: Never Smoker  . Smokeless tobacco: Never Used  . Alcohol use No     Comment: Non drinker/ No alcohol use.  . Drug use: No  . Sexual activity: Yes    Birth control/ protection: Surgical   Other Topics Concern  . Not on file   Social History Narrative  . No narrative on file    Allergies  Allergen Reactions  . Oxycodone Rash    Review of Systems  Constitutional: Positive for malaise/fatigue.  HENT: Negative.   Eyes: Negative.   Respiratory: Negative.   Cardiovascular: Positive for chest pain.  Gastrointestinal: Negative.   Genitourinary: Negative.   Musculoskeletal: Negative.   Skin: Negative.   Neurological: Negative.   Endo/Heme/Allergies: Negative.   Psychiatric/Behavioral: The patient is nervous/anxious.     Immunization History  Administered Date(s) Administered  . Influenza,inj,Quad PF,36+ Mos 01/10/2015, 01/10/2016  . Influenza-Unspecified 01/11/2013, 01/12/2014  . Tdap 06/29/2007    Objective:  BP 118/80 (BP Location: Left Arm, Patient Position: Sitting,  Cuff Size: Large)   Pulse 82   Temp 98.2 F (36.8 C) (Oral)   Resp 16   Wt 283 lb (128.4 kg)   SpO2 98%   BMI 44.32 kg/m   Physical Exam  Constitutional: She is oriented to person, place, and time and well-developed, well-nourished, and in no distress.  HENT:  Head: Normocephalic and atraumatic.  Right Ear: External ear normal.  Left Ear: External ear normal.  Nose: Nose normal.  Eyes: Conjunctivae are normal. No scleral icterus.    Neck: Neck supple. No thyromegaly present.  Cardiovascular: Normal rate, regular rhythm and normal heart sounds.   Pulmonary/Chest: Effort normal and breath sounds normal. She exhibits tenderness.  Abdominal: Soft.  Musculoskeletal: She exhibits tenderness.  Tender over upper outer chest wall which reproduces some of pain pt has been having.  Neurological: She is alert and oriented to person, place, and time. Gait normal. GCS score is 15.  Skin: Skin is warm and dry.  Psychiatric: Mood, memory, affect and judgment normal.    Lab Results  Component Value Date   WBC 7.4 04/12/2016   HGB 13.9 02/19/2014   HCT 41.4 04/12/2016   PLT 350 04/12/2016   GLUCOSE 86 04/12/2016   CHOL 195 04/12/2016   TRIG 88 04/12/2016   HDL 57 04/12/2016   LDLCALC 120 (H) 04/12/2016   TSH 2.000 04/12/2016   HGBA1C 5.5 04/12/2016    CMP     Component Value Date/Time   NA 141 04/12/2016 0829   NA 139 06/09/2012 1217   K 4.6 04/12/2016 0829   K 3.5 06/09/2012 1217   CL 100 04/12/2016 0829   CL 106 06/09/2012 1217   CO2 27 04/12/2016 0829   CO2 30 06/09/2012 1217   GLUCOSE 86 04/12/2016 0829   GLUCOSE 96 06/09/2012 1217   BUN 16 04/12/2016 0829   BUN 12 06/09/2012 1217   CREATININE 0.60 04/12/2016 0829   CREATININE 0.61 11/14/2012 0546   CALCIUM 9.9 04/12/2016 0829   CALCIUM 8.5 06/09/2012 1217   PROT 7.1 04/12/2016 0829   PROT 7.6 12/04/2011 0359   ALBUMIN 4.1 04/12/2016 0829   ALBUMIN 3.6 12/04/2011 0359   AST 14 04/12/2016 0829   AST 24 12/04/2011 0359   ALT 17 04/12/2016 0829   ALT 21 12/04/2011 0359   ALKPHOS 102 04/12/2016 0829   ALKPHOS 111 12/04/2011 0359   BILITOT 0.4 04/12/2016 0829   BILITOT 0.3 12/04/2011 0359   GFRNONAA 101 04/12/2016 0829   GFRNONAA >60 11/14/2012 0546   GFRAA 116 04/12/2016 0829   GFRAA >60 11/14/2012 0546    Assessment and Plan :  1. Chest pain, unspecified type Doubt cardiac but may need w/u for peace of mind.  - EKG 12-Lead - aspirin EC 81 MG  tablet; Take 1 tablet (81 mg total) by mouth daily.  Dispense: 30 tablet; Refill: 12  2. Chest wall pain  - Ambulatory referral to Cardiology  3. Gastroesophageal reflux disease, esophagitis presence not specified  - ranitidine (ZANTAC) 150 MG tablet; Take 1 tablet (150 mg total) by mouth 2 (two) times daily.  Dispense: 60 tablet; Refill: 12 4.Morbid Obesity 5.Stressors Recent death of Father and caring for acute on chronic illness of her Mother visiting from [place]. Will follow.  I have done the exam and reviewed the above chart and it is accurate to the best of my knowledge. Development worker, community has been used in this note in any air is in the dictation or transcription are unintentional.  Miguel Aschoff MD [facility] 07/05/2016 4:22 PM

## 2016-07-07 ENCOUNTER — Telehealth: Payer: Self-pay

## 2016-07-07 NOTE — Telephone Encounter (Signed)
Can you please call patient-aa

## 2016-07-07 NOTE — Telephone Encounter (Signed)
Patient's husband Coralyn Mark called and states patient was in to see Dr Rosanna Randy on 07/05/16 and was started on Zantac. She took first dose this morning and developed severe abdominal pain about 45 minutes after that. On the bottle it said to call doctor immediately if you develop abdominal pain. I advised him to make sure she does not take another one. What should she do at this time? Please review-aa

## 2016-07-07 NOTE — Telephone Encounter (Signed)
If still having severe abdominal would report to the ER. If not having any further pain would try Prevacid 15 mg twice daily over the counter.

## 2016-07-07 NOTE — Telephone Encounter (Signed)
Husband Andrea Beard

## 2016-07-08 ENCOUNTER — Encounter: Payer: Self-pay | Admitting: Family Medicine

## 2016-07-19 ENCOUNTER — Encounter: Payer: Self-pay | Admitting: Family Medicine

## 2016-09-06 ENCOUNTER — Encounter: Payer: Self-pay | Admitting: Obstetrics and Gynecology

## 2016-09-07 ENCOUNTER — Other Ambulatory Visit: Payer: Self-pay

## 2016-09-07 DIAGNOSIS — R6882 Decreased libido: Secondary | ICD-10-CM

## 2016-09-09 ENCOUNTER — Other Ambulatory Visit: Payer: BLUE CROSS/BLUE SHIELD

## 2016-09-09 DIAGNOSIS — R6882 Decreased libido: Secondary | ICD-10-CM

## 2016-09-10 LAB — TESTOSTERONE,FREE AND TOTAL
TESTOSTERONE: 9 ng/dL (ref 3–41)
Testosterone, Free: 1.3 pg/mL (ref 0.0–4.2)

## 2016-09-10 LAB — SEX HORMONE BINDING GLOBULIN: Sex Hormone Binding: 39.6 nmol/L (ref 17.3–125.0)

## 2016-09-26 ENCOUNTER — Encounter: Payer: Self-pay | Admitting: Family Medicine

## 2016-09-30 ENCOUNTER — Encounter: Payer: Self-pay | Admitting: Obstetrics and Gynecology

## 2016-09-30 ENCOUNTER — Ambulatory Visit (INDEPENDENT_AMBULATORY_CARE_PROVIDER_SITE_OTHER): Payer: BLUE CROSS/BLUE SHIELD | Admitting: Obstetrics and Gynecology

## 2016-09-30 VITALS — BP 141/82 | HR 80 | Ht 67.0 in | Wt 285.4 lb

## 2016-09-30 DIAGNOSIS — Z6841 Body Mass Index (BMI) 40.0 and over, adult: Secondary | ICD-10-CM

## 2016-09-30 DIAGNOSIS — Z79899 Other long term (current) drug therapy: Secondary | ICD-10-CM | POA: Diagnosis not present

## 2016-09-30 DIAGNOSIS — R6882 Decreased libido: Secondary | ICD-10-CM | POA: Diagnosis not present

## 2016-09-30 DIAGNOSIS — E6609 Other obesity due to excess calories: Secondary | ICD-10-CM

## 2016-09-30 DIAGNOSIS — N898 Other specified noninflammatory disorders of vagina: Secondary | ICD-10-CM

## 2016-09-30 DIAGNOSIS — IMO0001 Reserved for inherently not codable concepts without codable children: Secondary | ICD-10-CM

## 2016-09-30 NOTE — Patient Instructions (Signed)
1. Return in 6 weeks for follow-up 2. Continue with testosterone cream topically to the mons pubis and clitoris twice a week 3. If side effects persists, we will consider periodic Depo testosterone injections quarterly

## 2016-09-30 NOTE — Progress Notes (Signed)
GYN ENCOUNTER NOTE  Subjective:       Andrea Beard is a 61 y.o. [phone] female is here for gynecologic evaluation of the following issues:  1. 3 month follow up for low libido                                                             States her libido has improved since her injections several months ago and started using the testosterone cream. In the last 3-4 weeks she has had some aggression and irritablity which she attributed to the cream so she discontinued.  She also stated her weight gain may be contributing to her lower sex drive from self-esteem.  Gynecologic History No LMP recorded. Patient has had a hysterectomy. Contraception: status post hysterectomy   Obstetric History OB History  Gravida Para Term Preterm AB Living  3 2 2   1 2   SAB TAB Ectopic Multiple Live Births    1     2    # Outcome Date GA Lbr Len/2nd Weight Sex Delivery Anes PTL Lv  3 Term 35    M Vag-Spont   LIV  2 Term 41    M Vag-Spont   LIV  1 TAB              The following portions of the patient's history were reviewed and updated as appropriate: allergies, current medications, past family history, past medical history, past social history, past surgical history and problem list.  Review of Systems Review of Systems  Constitutional: Negative for chills, diaphoresis and fever.  Musculoskeletal: Negative for myalgias.  Skin: Negative for itching and rash.  Neurological: Negative for dizziness, weakness and headaches.  Psychiatric/Behavioral: Negative for depression. The patient is not nervous/anxious and does not have insomnia.   All other systems reviewed and are negative.  Objective:   BP (!) 141/82   Pulse 80   Ht 5\' 7"  (1.702 m)   Wt 285 lb 6.4 oz (129.5 kg)   BMI 44.70 kg/m  CONSTITUTIONAL: Well-developed, well-nourished female in no acute distress.  HENT:  Normocephalic, atraumatic.  SKIN: Skin is warm and dry. No rash noted. Not diaphoretic. No erythema. No  pallor. [place]: Alert and oriented to person, place, and time. PSYCHIATRIC: Normal mood and affect. Normal behavior. Normal judgment and thought content. CARDIOVASCULAR:Not Examined RESPIRATORY: Not Examined BREASTS: Not Examined PELVIC: Not Examined  Recent Results (from the past 2160 hour(s))  Testosterone,Free and Total     Status: None   Collection Time: 09/09/16  1:52 PM  Result Value Ref Range   Testosterone 9 3 - 41 ng/dL   Testosterone, Free 1.3 0.0 - 4.2 pg/mL  Sex hormone binding globulin     Status: None   Collection Time: 09/09/16  1:52 PM  Result Value Ref Range   Sex Hormone Binding 39.6 17.3 - 125.0 nmol/L   Assessment:  1. Decreased libido 2. Vaginal dryness 3. Obesity (BMI) of 40.0 to 44.9 in adult ([place]) 4. Possible testosterone side effects including education and aggressive behavior; not correlated with abnormal free and total testosterone levels   Plan:  1. Decreased libido; restart the combination testosterone cream 2. Vaginal dryness;  restart the combination testosterone cream 3. Obesity (BMI) of 40.0 to 44.9 in adult [facility]); continue healthy diet  change and exercise. 4. Restart testosterone cream twice weekly to the mons pubis and clitoris; if agitation/irritability symptoms persist, will discontinue topical cream and consider periodic injections of testosterone cypionate on a quarterly basis.  A total of 15 minutes were spent face-to-face with the patient during this encounter and over half of that time dealt with counseling and coordination of care.  Carlene Coria, Student PA [employer] Brayton Mars, MD   I have seen, interviewed, and examined the patient in conjunction with the [facility].A. student and affirm the diagnosis and management plan. Kelicia Youtz A. Sanoe Hazan, MD, FACOG   Note: This dictation was prepared with Dragon dictation along with smaller phrase technology. Any transcriptional errors that result from this process are  unintentional.

## 2016-11-17 ENCOUNTER — Encounter: Payer: Self-pay | Admitting: Family Medicine

## 2016-11-17 NOTE — Progress Notes (Signed)
Check BP weekly--OV2-4 weeks for this.

## 2016-11-17 NOTE — Progress Notes (Signed)
Patient advised. Follow up appointment scheduled.  

## 2016-11-17 NOTE — Progress Notes (Signed)
Patient walked into office this morning for nurse blood pressure check, patient states that she feels well today but had concerns of elevated blood pressure. Patient reports that the last three times she went to pharmacy and checked her blood pressure reading it was always 150/100. Patient denies any symptoms of stress, anxiety, frequent headaches or dizziness. Patients blood pressure was within normal range today, I advised that she continue to monitor her blood pressure readings and log in book along with any symptoms that she may be experiencing at the time. Patient was advised that a CMA will call back if she needs to return to office sooner. KW  Vitals:   11/17/16 1053  BP: 130/86  Pulse: 92  Resp: 16  SpO2: 97%

## 2016-11-17 NOTE — Progress Notes (Signed)
Please review-aa 

## 2016-11-23 ENCOUNTER — Encounter: Payer: BLUE CROSS/BLUE SHIELD | Admitting: Obstetrics and Gynecology

## 2016-11-24 NOTE — Progress Notes (Signed)
Patient comes in today for a BP check. She feels well today. She does mention that occasionally her heart races, but it comes and goes. She does not currently take anything for HTN, but states it runs in her family. She reports that Dr. Rosanna Randy recommended that she stop by occasionally for her to have her BP checked.

## 2016-12-01 ENCOUNTER — Encounter: Payer: Self-pay | Admitting: Emergency Medicine

## 2016-12-02 ENCOUNTER — Ambulatory Visit (INDEPENDENT_AMBULATORY_CARE_PROVIDER_SITE_OTHER): Payer: BLUE CROSS/BLUE SHIELD | Admitting: Family Medicine

## 2016-12-02 VITALS — BP 128/86 | HR 76 | Temp 98.2°F | Resp 14 | Wt 291.0 lb

## 2016-12-02 DIAGNOSIS — Z807 Family history of other malignant neoplasms of lymphoid, hematopoietic and related tissues: Secondary | ICD-10-CM | POA: Diagnosis not present

## 2016-12-02 DIAGNOSIS — R0781 Pleurodynia: Secondary | ICD-10-CM | POA: Diagnosis not present

## 2016-12-02 DIAGNOSIS — R1319 Other dysphagia: Secondary | ICD-10-CM | POA: Diagnosis not present

## 2016-12-02 DIAGNOSIS — R0789 Other chest pain: Secondary | ICD-10-CM

## 2016-12-02 DIAGNOSIS — F419 Anxiety disorder, unspecified: Secondary | ICD-10-CM

## 2016-12-02 DIAGNOSIS — Z8639 Personal history of other endocrine, nutritional and metabolic disease: Secondary | ICD-10-CM

## 2016-12-02 NOTE — Progress Notes (Signed)
Andrea Beard  MRN: [id number] DOB: 1955/06/10  Subjective:  HPI   Patient is here to follow up on b/p. On 11/17/16 patient came in for nurse B/P check due to getting elevated readings at the pharmacy of 150/100. That day b/p here was 130/86. On 11/24/16 during nurse visit b/p was 138/82. Patient has not been checking b/p at the pharmacy anymore.  Also patient wanted to address pain she has had for 2 months under left breat/rib area. In the past week it has been a sharp pain. Patient has a sister with Multiple myeloma and remembers her saying she was hurting in her joints and patient was worried. Her sister found out she has multiple myeloma due to blood work.   Patient Active Problem List   Diagnosis Date Noted  . Chest pain 06/24/2015  . Combined fat and carbohydrate induced hyperlipemia 06/24/2015  . MI (mitral incompetence) 06/24/2015  . 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 Cystocele 02/12/2015  . Status post laparoscopic assisted vaginal hysterectomy (LAVH) 02/12/2015  . Adenomyosis 02/12/2015  . Abdominal pain, left upper quadrant 11/22/2014  . Abnormal blood level of cobalt 11/22/2014  . Anxiety 11/22/2014  . Arthropathia 11/22/2014  . Decreased libido 11/22/2014  . Clinical depression 11/22/2014  . Fibroid 11/22/2014  . Borderline diabetes 11/22/2014  . History of repair of hip joint 11/22/2014  . Cardiac murmur 11/22/2014  . Titanium poisoning 11/22/2014  . Arthralgia of hip 11/22/2014  . Decreased potassium in the blood 11/22/2014  . Asymptomatic postmenopausal status 11/22/2014  . Extreme obesity 11/22/2014  . Cramp of limb 11/22/2014  . Bacterial upper respiratory infection 11/22/2014  . Screening examination for poliomyelitis 11/22/2014  . Foot tendinitis 11/22/2014  . Head revolving around 11/22/2014  . Menopause 11/22/2014  . Idiopathic localized osteoarthropathy 10/02/2012    Past Medical History:  Diagnosis Date  . Anxiety   . Arthritis   .  Arthropathy   . Cancer ([place])    skin  . Depression   . H/O bilateral hip replacements   . Hypopotassemia   . Morbid obesity ([place])     Social History   Social History  . Marital status: Married    Spouse name: N/A  . Number of children: N/A  . Years of education: N/A   Occupational History  . Not on file.   Social History Main Topics  . Smoking status: Never Smoker  . Smokeless tobacco: Never Used  . Alcohol use No     Comment: Non drinker/ No alcohol use.  . Drug use: No  . Sexual activity: Yes    Birth control/ protection: Surgical   Other Topics Concern  . Not on file   Social History Narrative  . No narrative on file    Outpatient Encounter Prescriptions as of 12/02/2016  Medication Sig  . ALPRAZolam (XANAX) 0.5 MG tablet 1 to 2 tablets daily as needed  . aspirin EC 81 MG tablet Take 1 tablet (81 mg total) by mouth daily.   No facility-administered encounter medications on file as of 12/02/2016.     Allergies  Allergen Reactions  . Oxycodone Rash    Review of Systems  Constitutional: Negative.   Respiratory: Negative.   Cardiovascular: Negative.   Gastrointestinal: Negative.   Musculoskeletal: Positive for myalgias.  Neurological: Positive for headaches.    Objective:  BP 128/86   Pulse 76   Temp 98.2 F (36.8 C)   Resp 14   Wt 291 lb (132 kg)   BMI  45.58 kg/m   Physical Exam  Constitutional: She is oriented to person, place, and time and well-developed, well-nourished, and in no distress.  Obese WF NAD.  HENT:  Head: Normocephalic and atraumatic.  Right Ear: External ear normal.  Left Ear: External ear normal.  Nose: Nose normal.  Eyes: Pupils are equal, round, and reactive to light. Conjunctivae are normal.  Cardiovascular: Normal rate, regular rhythm, normal heart sounds and intact distal pulses.  Exam reveals no gallop.   No murmur heard. Pulmonary/Chest: Effort normal and breath sounds normal. No respiratory distress. She has no  wheezes.  Abdominal: Soft. There is tenderness (mild epigastric ).  Neurological: She is alert and oriented to person, place, and time.   Assessment and Plan :  1. Chest wall pain I think this is a muscle strain. Will check blood work due to family history of multiple myeloma.Pt is worried about FH of cancer. - Protein electrophoresis B/P is good today. Discussed with patient treating this issue. Patient is going out of town soon. Will let patient check b/p and we will recheck on the next visit. May need to start medication.  2. Family history of multiple myeloma - Protein electrophoresis  3. Rib pain on left side  - Protein electrophoresis  4. History of hyperglycemia - Hemoglobin A1c  5. Anxiety Stable. 6.Obesity 7.BP Check readings at home and recheck 3 months. HPI, Exam and A&P transcribed by Tiffany Kocher, RMA under direction and in the presence of Miguel Aschoff, MD. I have done the exam and reviewed the chart and it is accurate to the best of my knowledge. Development worker, community has been used and  any errors in dictation or transcription are unintentional. Miguel Aschoff M.D. [facility]

## 2016-12-02 NOTE — Patient Instructions (Signed)
Check b/p and write down the readings you get and we will re check on the next visit.

## 2016-12-03 LAB — COMPREHENSIVE METABOLIC PANEL
ALBUMIN: 4.5 g/dL (ref 3.5–5.5)
ALT: 15 IU/L (ref 0–32)
AST: 20 IU/L (ref 0–40)
Albumin/Globulin Ratio: 1.8 (ref 1.2–2.2)
Alkaline Phosphatase: 96 IU/L (ref 39–117)
BUN/Creatinine Ratio: 27 — ABNORMAL HIGH (ref 9–23)
BUN: 17 mg/dL (ref 6–24)
Bilirubin Total: <0.2 mg/dL (ref 0.0–1.2)
CALCIUM: 9.5 mg/dL (ref 8.7–10.2)
CO2: 23 mmol/L (ref 20–29)
CREATININE: 0.63 mg/dL (ref 0.57–1.00)
Chloride: 102 mmol/L (ref 96–106)
GFR calc Af Amer: 114 mL/min/{1.73_m2} (ref >59)
GFR, EST NON AFRICAN AMERICAN: 99 mL/min/{1.73_m2} (ref >59)
Globulin, Total: 2.5 g/dL (ref 1.5–4.5)
Glucose: 96 mg/dL (ref 65–99)
Potassium: 5.1 mmol/L (ref 3.5–5.2)
SODIUM: 142 mmol/L (ref 134–144)
Total Protein: 7 g/dL (ref 6.0–8.5)

## 2016-12-03 LAB — CBC WITH DIFFERENTIAL/PLATELET
BASOS ABS: 0 10*3/uL (ref 0.0–0.2)
Basos: 0 %
EOS (ABSOLUTE): 0.2 10*3/uL (ref 0.0–0.4)
Eos: 2 %
HEMOGLOBIN: 13.7 g/dL (ref 11.1–15.9)
Hematocrit: 41.5 % (ref 34.0–46.6)
IMMATURE GRANS (ABS): 0 10*3/uL (ref 0.0–0.1)
Immature Granulocytes: 0 %
LYMPHS ABS: 2.7 10*3/uL (ref 0.7–3.1)
LYMPHS: 26 %
MCH: 29.5 pg (ref 26.6–33.0)
MCHC: 33 g/dL (ref 31.5–35.7)
MCV: 89 fL (ref 79–97)
MONOCYTES: 8 %
Monocytes Absolute: 0.8 10*3/uL (ref 0.1–0.9)
NEUTROS ABS: 6.5 10*3/uL (ref 1.4–7.0)
Neutrophils: 64 %
Platelets: 352 10*3/uL (ref 150–379)
RBC: 4.64 x10E6/uL (ref 3.77–5.28)
RDW: 14.7 % (ref 12.3–15.4)
WBC: 10.3 10*3/uL (ref 3.4–10.8)

## 2016-12-03 LAB — PROTEIN ELECTROPHORESIS
A/G Ratio: 1.2 (ref 0.7–1.7)
ALBUMIN ELP: 3.8 g/dL (ref 2.9–4.4)
ALPHA 2: 0.8 g/dL (ref 0.4–1.0)
Alpha 1: 0.2 g/dL (ref 0.0–0.4)
BETA: 1.1 g/dL (ref 0.7–1.3)
GLOBULIN, TOTAL: 3.2 g/dL (ref 2.2–3.9)
Gamma Globulin: 1.1 g/dL (ref 0.4–1.8)

## 2016-12-03 LAB — HEMOGLOBIN A1C
ESTIMATED AVERAGE GLUCOSE: 117 mg/dL
HEMOGLOBIN A1C: 5.7 % — AB (ref 4.8–5.6)

## 2016-12-03 LAB — TSH: TSH: 1.36 u[IU]/mL (ref 0.450–4.500)

## 2016-12-07 ENCOUNTER — Telehealth: Payer: Self-pay

## 2016-12-07 NOTE — Telephone Encounter (Signed)
-----   Message from Jerrol Banana., MD sent at 12/07/2016 11:43 AM EDT ----- Labs okay. Mild dehydration.

## 2016-12-07 NOTE — Telephone Encounter (Signed)
Patient advised as directed below.  Thanks,  -Antinio Sanderfer 

## 2017-01-13 ENCOUNTER — Ambulatory Visit: Payer: BLUE CROSS/BLUE SHIELD

## 2017-01-21 ENCOUNTER — Encounter: Payer: Self-pay | Admitting: Emergency Medicine

## 2017-01-21 ENCOUNTER — Emergency Department
Admission: EM | Admit: 2017-01-21 | Discharge: 2017-01-21 | Disposition: A | Discharge status: Home / Self Care | Payer: BLUE CROSS/BLUE SHIELD | Attending: Emergency Medicine | Admitting: Emergency Medicine

## 2017-01-21 DIAGNOSIS — Z23 Encounter for immunization: Secondary | ICD-10-CM | POA: Insufficient documentation

## 2017-01-21 DIAGNOSIS — Z2914 Encounter for prophylactic rabies immune globin: Secondary | ICD-10-CM | POA: Insufficient documentation

## 2017-01-21 DIAGNOSIS — Z203 Contact with and (suspected) exposure to rabies: Secondary | ICD-10-CM | POA: Diagnosis not present

## 2017-01-21 DIAGNOSIS — Z7982 Long term (current) use of aspirin: Secondary | ICD-10-CM | POA: Insufficient documentation

## 2017-01-21 MED ORDER — RABIES VACCINE, PCEC IM SUSR
1.0000 mL | Freq: Once | INTRAMUSCULAR | Status: AC
  Administered 2017-01-21: 1 mL via INTRAMUSCULAR
  Filled 2017-01-21: qty 1

## 2017-01-21 MED ORDER — RABIES IMMUNE GLOBULIN 150 UNIT/ML IM INJ
20.0000 [IU]/kg | INJECTION | Freq: Once | INTRAMUSCULAR | Status: AC
  Administered 2017-01-21: 2625 [IU] via INTRAMUSCULAR
  Filled 2017-01-21: qty 17.5

## 2017-01-21 NOTE — Discharge Instructions (Signed)
Return to the emergency room or [place] urgent care for remaining immunizations.  Return on October 10/22, 10/26 and Novermber 2.

## 2017-01-21 NOTE — ED Notes (Signed)
Pt ambulatory with steady gait to POV with significant other. Skin PWD. NAD. VSS. No questions or concerns voiced.

## 2017-01-21 NOTE — ED Provider Notes (Signed)
[facility] Emergency Department Provider Note   ____________________________________________   First MD Initiated Contact with Patient 01/21/17 1515     (approximate)  I have reviewed the triage vital signs and the nursing notes.   HISTORY  Chief Complaint Rabies Injection   HPI Andrea Beard is a 61 y.o. female is here with her husband after being exposed to a bat. Husband states they're watching TV when the bat began flying around the house and then went down the hallway. Their dog cornered it and killed it on the carpet and then came over to her husband and began licking him. Patient was called today multiple agencies to report that the bat tested positive for rabies. They were instructed to come to the emergency department for rabies vaccinations.  Family has no idea how long the bat had  been in the house.   Past Medical History:  Diagnosis Date  . Anxiety   . Arthritis   . Arthropathy   . Cancer ([place])    skin  . Depression   . H/O bilateral hip replacements   . Hypopotassemia   . Morbid obesity [facility])     Patient Active Problem List   Diagnosis Date Noted  . Chest pain 06/24/2015  . Combined fat and carbohydrate induced hyperlipemia 06/24/2015  . MI (mitral incompetence) 06/24/2015  . 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 Cystocele 02/12/2015  . Status post laparoscopic assisted vaginal hysterectomy (LAVH) 02/12/2015  . Adenomyosis 02/12/2015  . Abdominal pain, left upper quadrant 11/22/2014  . Abnormal blood level of cobalt 11/22/2014  . Anxiety 11/22/2014  . Arthropathia 11/22/2014  . Decreased libido 11/22/2014  . Clinical depression 11/22/2014  . Fibroid 11/22/2014  . Borderline diabetes 11/22/2014  . History of repair of hip joint 11/22/2014  . Cardiac murmur 11/22/2014  . Titanium poisoning 11/22/2014  . Arthralgia of hip 11/22/2014  . Decreased potassium in the blood 11/22/2014  . Asymptomatic postmenopausal status  11/22/2014  . Extreme obesity 11/22/2014  . Cramp of limb 11/22/2014  . Bacterial upper respiratory infection 11/22/2014  . Screening examination for poliomyelitis 11/22/2014  . Foot tendinitis 11/22/2014  . Head revolving around 11/22/2014  . Menopause 11/22/2014  . Idiopathic localized osteoarthropathy 10/02/2012    Past Surgical History:  Procedure Laterality Date  . ABDOMINAL HYSTERECTOMY     Dr. Quenten Raven  . BREAST BIOPSY Right    negative  . Left Hip raplacement  Left 2008 and right 2010    Prior to Admission medications   Medication Sig Start Date End Date Taking? Authorizing Provider  ALPRAZolam Duanne Moron) 0.5 MG tablet 1 to 2 tablets daily as needed 06/28/16   Jerrol Banana., MD  aspirin EC 81 MG tablet Take 1 tablet (81 mg total) by mouth daily. 07/05/16   Jerrol Banana., MD    Allergies Oxycodone  Family History  Problem Relation Age of Onset  . Hypertension Mother   . Cancer Mother        Breast  . Breast cancer Mother 68  . Heart disease Father   . Hypertension Father   . Arthritis Sister   . Thyroid disease Brother   . Arthritis Sister        arthritis with bilateral hip replacent and multiple myeloma disease  . Bladder Cancer Sister   . Esophageal cancer Brother   . Stomach cancer Brother   . Melanoma Brother   . Bladder Cancer Brother   . Diabetes Paternal Grandfather  Social History Social History  Substance Use Topics  . Smoking status: Never Smoker  . Smokeless tobacco: Never Used  . Alcohol use No     Comment: Non drinker/ No alcohol use.    Review of Systems  Constitutional: No fever/chills Cardiovascular: Denies chest pain. Respiratory: Denies shortness of breath. Gastrointestinal:  No nausea, no vomiting.   Musculoskeletal: Negative for back pain. Skin: Negative for rash. Neurological: Negative for  focal weakness or numbness. ____________________________________________   PHYSICAL EXAM:  VITAL SIGNS: ED  Triage Vitals  Enc Vitals Group     BP      Pulse      Resp      Temp      Temp src      SpO2      Weight      Height      Head Circumference      Peak Flow      Pain Score      Pain Loc      Pain Edu?      Excl. in [place]?    Constitutional: Alert and oriented. Well appearing and in no acute distress. Eyes: Conjunctivae are normal.  Head: Atraumatic. Neck: No stridor.   Cardiovascular: Normal rate, regular rhythm. Grossly normal heart sounds.  Good peripheral circulation. Respiratory: Normal respiratory effort.  No retractions. Lungs CTAB. Musculoskeletal: moves upper and lower extremities without any difficulty. Neurologic:  Normal speech and language. No gross focal neurologic deficits are appreciated.  Skin:  Skin is warm, dry and intact.  ___________________________________________   LABS (all labs ordered are listed, but only abnormal results are displayed)  Labs Reviewed - No data to display   PROCEDURES  Procedure(s) performed: None  Procedures  Critical Care performed: No  ____________________________________________   INITIAL IMPRESSION / ASSESSMENT AND PLAN / ED COURSE After discussion with patient it was decided the patient's condition should get the rabies immunizations. We discussed further RabAvert at med in urgent care for the following injections. Patient will follow-up with their PCP if any continued concerns. All questions about rabies was answered for patient and husband.    ___________________________________________   FINAL CLINICAL IMPRESSION(S) / ED DIAGNOSES  Final diagnoses:  Rabies exposure      NEW MEDICATIONS STARTED DURING THIS VISIT:  New Prescriptions   No medications on file     Note:  This document was prepared using Dragon voice recognition software and may include unintentional dictation errors.    Johnn Hai, PA-C 01/21/17 1736    Harvest Dark, MD 01/24/17 1549

## 2017-01-21 NOTE — ED Triage Notes (Signed)
Possible exposure to a bat.

## 2017-01-22 ENCOUNTER — Ambulatory Visit: Payer: BLUE CROSS/BLUE SHIELD

## 2017-01-24 ENCOUNTER — Ambulatory Visit
Admission: EM | Admit: 2017-01-24 | Discharge: 2017-01-24 | Disposition: A | Discharge status: Home / Self Care | Payer: BLUE CROSS/BLUE SHIELD | Attending: Family Medicine | Admitting: Family Medicine

## 2017-01-24 DIAGNOSIS — Z23 Encounter for immunization: Secondary | ICD-10-CM | POA: Diagnosis not present

## 2017-01-24 DIAGNOSIS — Z203 Contact with and (suspected) exposure to rabies: Secondary | ICD-10-CM | POA: Diagnosis not present

## 2017-01-24 MED ORDER — RABIES VACCINE, PCEC IM SUSR
1.0000 mL | Freq: Once | INTRAMUSCULAR | Status: AC
  Administered 2017-01-24: 1 mL via INTRAMUSCULAR

## 2017-01-24 NOTE — ED Triage Notes (Signed)
Here for second rabies shot.

## 2017-01-26 ENCOUNTER — Encounter: Payer: Self-pay | Admitting: Family Medicine

## 2017-01-27 ENCOUNTER — Encounter: Payer: Self-pay | Admitting: Obstetrics and Gynecology

## 2017-01-28 ENCOUNTER — Encounter: Payer: Self-pay | Admitting: Emergency Medicine

## 2017-01-28 ENCOUNTER — Ambulatory Visit
Admission: EM | Admit: 2017-01-28 | Discharge: 2017-01-28 | Disposition: A | Discharge status: Home / Self Care | Payer: BLUE CROSS/BLUE SHIELD | Attending: Family Medicine | Admitting: Family Medicine

## 2017-01-28 DIAGNOSIS — Z23 Encounter for immunization: Secondary | ICD-10-CM

## 2017-01-28 DIAGNOSIS — Z203 Contact with and (suspected) exposure to rabies: Secondary | ICD-10-CM | POA: Diagnosis not present

## 2017-01-28 MED ORDER — RABIES VACCINE, PCEC IM SUSR
1.0000 mL | Freq: Once | INTRAMUSCULAR | Status: AC
  Administered 2017-01-28: 1 mL via INTRAMUSCULAR

## 2017-01-28 NOTE — ED Triage Notes (Signed)
Patient in today for 3rd Rabies Injection.

## 2017-02-02 ENCOUNTER — Ambulatory Visit: Payer: Self-pay

## 2017-02-02 ENCOUNTER — Encounter: Payer: Self-pay | Admitting: Family Medicine

## 2017-02-02 DIAGNOSIS — J40 Bronchitis, not specified as acute or chronic: Secondary | ICD-10-CM

## 2017-02-04 ENCOUNTER — Encounter: Payer: Self-pay | Admitting: *Deleted

## 2017-02-04 ENCOUNTER — Ambulatory Visit
Admission: EM | Admit: 2017-02-04 | Discharge: 2017-02-04 | Disposition: A | Discharge status: Home / Self Care | Payer: BLUE CROSS/BLUE SHIELD | Attending: Family Medicine | Admitting: Family Medicine

## 2017-02-04 DIAGNOSIS — Z23 Encounter for immunization: Secondary | ICD-10-CM

## 2017-02-04 DIAGNOSIS — J0100 Acute maxillary sinusitis, unspecified: Secondary | ICD-10-CM

## 2017-02-04 DIAGNOSIS — J209 Acute bronchitis, unspecified: Secondary | ICD-10-CM

## 2017-02-04 DIAGNOSIS — Z203 Contact with and (suspected) exposure to rabies: Secondary | ICD-10-CM

## 2017-02-04 LAB — RAPID STREP SCREEN (MED CTR MEBANE ONLY): STREPTOCOCCUS, GROUP A SCREEN (DIRECT): NEGATIVE

## 2017-02-04 MED ORDER — RABIES VACCINE, PCEC IM SUSR
1.0000 mL | Freq: Once | INTRAMUSCULAR | Status: AC
  Administered 2017-02-04: 1 mL via INTRAMUSCULAR

## 2017-02-04 MED ORDER — AMOXICILLIN 875 MG PO TABS: 875.0000 mg | ORAL_TABLET | Freq: Two times a day (BID) | ORAL | 0 refills | Status: DC

## 2017-02-04 NOTE — ED Provider Notes (Signed)
[facility]    CSN: [id number] Arrival date & time: 02/04/17  1117     History   Chief Complaint Chief Complaint  Patient presents with  . Rabies Injection    HPI Andrea Beard is a 61 y.o. female.   The history is provided by the patient.  URI  Presenting symptoms: congestion, cough, facial pain and fatigue   Onset quality:  Sudden Duration:  1 week Timing:  Constant Progression:  Worsening Chronicity:  New Relieved by:  Nothing Ineffective treatments:  OTC medications Associated symptoms: headaches and sinus pain   Associated symptoms: no wheezing   Risk factors: not elderly, no chronic cardiac disease, no chronic kidney disease, no chronic respiratory disease, no diabetes mellitus, no immunosuppression, no recent illness, no recent travel and no sick contacts     Past Medical History:  Diagnosis Date  . Anxiety   . Arthritis   . Arthropathy   . Cancer ([place])    skin  . Depression   . H/O bilateral hip replacements   . Hypopotassemia   . Morbid obesity [facility])     Patient Active Problem List   Diagnosis Date Noted  . Chest pain 06/24/2015  . Combined fat and carbohydrate induced hyperlipemia 06/24/2015  . MI (mitral incompetence) 06/24/2015  . 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 Cystocele 02/12/2015  . Status post laparoscopic assisted vaginal hysterectomy (LAVH) 02/12/2015  . Adenomyosis 02/12/2015  . Abdominal pain, left upper quadrant 11/22/2014  . Abnormal blood level of cobalt 11/22/2014  . Anxiety 11/22/2014  . Arthropathia 11/22/2014  . Decreased libido 11/22/2014  . Clinical depression 11/22/2014  . Fibroid 11/22/2014  . Borderline diabetes 11/22/2014  . History of repair of hip joint 11/22/2014  . Cardiac murmur 11/22/2014  . Titanium poisoning 11/22/2014  . Arthralgia of hip 11/22/2014  . Decreased potassium in the blood 11/22/2014  . Asymptomatic postmenopausal status 11/22/2014  . Extreme obesity 11/22/2014  . Cramp of  limb 11/22/2014  . Bacterial upper respiratory infection 11/22/2014  . Screening examination for poliomyelitis 11/22/2014  . Foot tendinitis 11/22/2014  . Head revolving around 11/22/2014  . Menopause 11/22/2014  . Idiopathic localized osteoarthropathy 10/02/2012    Past Surgical History:  Procedure Laterality Date  . ABDOMINAL HYSTERECTOMY     Dr. Quenten Raven  . BREAST BIOPSY Right    negative  . Left Hip raplacement  Left 2008 and right 2010    OB History    Gravida Para Term Preterm AB Living   3 2 2   1 2    SAB TAB Ectopic Multiple Live Births     1     2       Home Medications    Prior to Admission medications   Medication Sig Start Date End Date Taking? Authorizing Provider  ALPRAZolam Duanne Moron) 0.5 MG tablet 1 to 2 tablets daily as needed 06/28/16  Yes Jerrol Banana., MD  aspirin EC 81 MG tablet Take 1 tablet (81 mg total) by mouth daily. 07/05/16  Yes Jerrol Banana., MD  amoxicillin (AMOXIL) 875 MG tablet Take 1 tablet (875 mg total) by mouth 2 (two) times daily. 02/04/17   Norval Gable, MD    Family History Family History  Problem Relation Age of Onset  . Hypertension Mother   . Cancer Mother        Breast  . Breast cancer Mother 54  . Heart disease Father   . Hypertension Father   . Arthritis Sister   .  Thyroid disease Brother   . Arthritis Sister        arthritis with bilateral hip replacent and multiple myeloma disease  . Bladder Cancer Sister   . Esophageal cancer Brother   . Stomach cancer Brother   . Melanoma Brother   . Bladder Cancer Brother   . Diabetes Paternal Grandfather     Social History Social History  Substance Use Topics  . Smoking status: Never Smoker  . Smokeless tobacco: Never Used  . Alcohol use No     Comment: Non drinker/ No alcohol use.     Allergies   Oxycodone   Review of Systems Review of Systems  Constitutional: Positive for fatigue.  HENT: Positive for congestion and sinus pain.   Respiratory:  Positive for cough. Negative for wheezing.   Neurological: Positive for headaches.     Physical Exam Triage Vital Signs ED Triage Vitals [02/04/17 1138]  Enc Vitals Group     BP (!) 142/96     Pulse Rate 97     Resp 16     Temp 98.1 F (36.7 C)     Temp Source Oral     SpO2 98 %     Weight      Height      Head Circumference      Peak Flow      Pain Score      Pain Loc      Pain Edu?      Excl. in [place]?    No data found.   Updated Vital Signs BP (!) 142/96 (BP Location: Left Arm)   Pulse 97   Temp 98.1 F (36.7 C) (Oral)   Resp 16   SpO2 98%   Visual Acuity Right Eye Distance:   Left Eye Distance:   Bilateral Distance:    Right Eye Near:   Left Eye Near:    Bilateral Near:     Physical Exam  Constitutional: She appears well-developed and well-nourished. No distress.  HENT:  Head: Normocephalic and atraumatic.  Right Ear: Tympanic membrane, external ear and ear canal normal.  Left Ear: Tympanic membrane, external ear and ear canal normal.  Nose: Mucosal edema and rhinorrhea present. No nose lacerations, sinus tenderness, nasal deformity, septal deviation or nasal septal hematoma. No epistaxis.  No foreign bodies. Right sinus exhibits maxillary sinus tenderness and frontal sinus tenderness. Left sinus exhibits maxillary sinus tenderness and frontal sinus tenderness.  Mouth/Throat: Uvula is midline, oropharynx is clear and moist and mucous membranes are normal. No oropharyngeal exudate.  Eyes: Pupils are equal, round, and reactive to light. Conjunctivae and EOM are normal. Right eye exhibits no discharge. Left eye exhibits no discharge. No scleral icterus.  Neck: Normal range of motion. Neck supple. No thyromegaly present.  Cardiovascular: Normal rate, regular rhythm and normal heart sounds.   Pulmonary/Chest: Effort normal and breath sounds normal. No respiratory distress. She has no wheezes. She has no rales.  Lymphadenopathy:    She has no cervical adenopathy.    Skin: She is not diaphoretic.  Nursing note and vitals reviewed.    UC Treatments / Results  Labs (all labs ordered are listed, but only abnormal results are displayed) Labs Reviewed  RAPID STREP SCREEN (NOT AT [facility])  CULTURE, GROUP A STREP [facility])    EKG  EKG Interpretation None       Radiology No results found.  Procedures Procedures (including critical care time)  Medications Ordered in UC Medications  rabies vaccine (RABAVERT) injection 1  mL (1 mL Intramuscular Given 02/04/17 1157)     Initial Impression / Assessment and Plan / UC Course  I have reviewed the triage vital signs and the nursing notes.  Pertinent labs & imaging results that were available during my care of the patient were reviewed by me and considered in my medical decision making (see chart for details).       Final Clinical Impressions(s) / UC Diagnoses   Final diagnoses:  Acute maxillary sinusitis, recurrence not specified  Acute bronchitis, unspecified organism    New Prescriptions Discharge Medication List as of 02/04/2017 12:20 PM    START taking these medications   Details  amoxicillin (AMOXIL) 875 MG tablet Take 1 tablet (875 mg total) by mouth 2 (two) times daily., Starting Fri 02/04/2017, Normal       1. diagnosis reviewed with patient/ 2. rx as per orders above; reviewed possible side effects, interactions, risks and benefits  3. Recommend supportive treatment with rest, fluids, otc flonase 4. Follow-up prn if symptoms worsen or don't improve Controlled Substance Prescriptions [place] Controlled Substance Registry consulted? Not Applicable   Norval Gable, MD 02/04/17 [phone]

## 2017-02-04 NOTE — ED Triage Notes (Signed)
Here for 4th Rabies shot. Pt is also c/o productive cough-clear, runny nose, head congestion, sore throat, and muted hearing. Denies fever.

## 2017-02-07 LAB — CULTURE, GROUP A STREP (THRC)

## 2017-02-08 ENCOUNTER — Telehealth: Payer: Self-pay | Admitting: Family Medicine

## 2017-02-08 MED ORDER — ALBUTEROL SULFATE HFA 108 (90 BASE) MCG/ACT IN AERS: 2.0000 | INHALATION_SPRAY | Freq: Four times a day (QID) | RESPIRATORY_TRACT | 0 refills | Status: DC | PRN

## 2017-02-08 NOTE — Telephone Encounter (Signed)
Have sent prescription for albuterol in inhaler to [facility].

## 2017-02-08 NOTE — Telephone Encounter (Signed)
Pt had sent a mychart message and pt had responded back to [employer]. Pt stated that she thought an inhaler was going too be sent to [facility]. I advised that Tawanna Sat had already left for the day. Pt wanted to see if someone else in the office could take care of this because she feels she needs the inhaler today. Please advise. Thanks TNP

## 2017-02-08 NOTE — Telephone Encounter (Signed)
Can you please look at this?-Andrea Beard Estell Harpin, RMA

## 2017-02-09 MED ORDER — ALBUTEROL SULFATE HFA 108 (90 BASE) MCG/ACT IN AERS: 2.0000 | INHALATION_SPRAY | Freq: Four times a day (QID) | RESPIRATORY_TRACT | 0 refills | Status: DC | PRN

## 2017-02-09 NOTE — Addendum Note (Signed)
Addended by: Mar Daring on: 02/09/2017 08:06 AM   Modules accepted: Orders

## 2017-02-09 NOTE — Telephone Encounter (Signed)
Patient advised as below.  

## 2017-02-11 NOTE — Progress Notes (Signed)
Patient ID: Andrea Beard, female   DOB: 05/09/1955, 61 y.o.   MRN: [id number] ANNUAL PREVENTATIVE CARE GYN  ENCOUNTER NOTE  Subjective:       Andrea Beard is a 61 y.o. G3P2 female here for a routine annual gynecologic exam.  Current complaints:  1. None  Recently had to have rabies injections because of bat found in the house that was rabies positive. Juliann Pulse is no longer using the testosterone cream topically for low libido.  She may want to consider the quarterly testosterone cypionate injection.  [name] remains sexually active. She has gained 18 pounds in the past year due to multiple stressors.  She is strongly encouraged to lose that weight which she gained over the past year.   Gynecologic History No LMP recorded. Patient has had a hysterectomy. Contraception: status post hysterectomyLAVH with anterior, posterior colporrhaphy Last Pap: n/a. Results were: normal Last mammogram: 03/2016 birad 1. Results were: normal Recurrent grade 2 cystocele, minimally symptomatic  Obstetric History OB History  Gravida Para Term Preterm AB Living  3 2 2   1 2   SAB TAB Ectopic Multiple Live Births    1     2    # Outcome Date GA Lbr Len/2nd Weight Sex Delivery Anes PTL Lv  3 Term 35    M Vag-Spont   LIV  2 Term 49    M Vag-Spont   LIV  1 TAB               Past Medical History:  Diagnosis Date  . Anxiety   . Arthritis   . Arthropathy   . Cancer ([place])    skin  . Depression   . H/O bilateral hip replacements   . Hypopotassemia   . Morbid obesity ([place])     Past Surgical History:  Procedure Laterality Date  . ABDOMINAL HYSTERECTOMY     Dr. Quenten Raven  . BREAST BIOPSY Right    negative  . Left Hip raplacement  Left 2008 and right 2010    Current Outpatient Medications on File Prior to Visit  Medication Sig Dispense Refill  . albuterol (PROVENTIL HFA;VENTOLIN HFA) 108 (90 Base) MCG/ACT inhaler Inhale 2 puffs every 6 (six) hours as needed into the lungs for wheezing  or shortness of breath. 1 Inhaler 0  . ALPRAZolam (XANAX) 0.5 MG tablet 1 to 2 tablets daily as needed 60 tablet 3  . amoxicillin (AMOXIL) 875 MG tablet Take 1 tablet (875 mg total) by mouth 2 (two) times daily. 20 tablet 0  . aspirin EC 81 MG tablet Take 1 tablet (81 mg total) by mouth daily. 30 tablet 12   No current facility-administered medications on file prior to visit.     Allergies  Allergen Reactions  . Oxycodone Rash    Social History   Socioeconomic History  . Marital status: Married    Spouse name: Not on file  . Number of children: Not on file  . Years of education: Not on file  . Highest education level: Not on file  Social Needs  . Financial resource strain: Not on file  . Food insecurity - worry: Not on file  . Food insecurity - inability: Not on file  . Transportation needs - medical: Not on file  . Transportation needs - non-medical: Not on file  Occupational History  . Not on file  Tobacco Use  . Smoking status: Never Smoker  . Smokeless tobacco: Never Used  Substance and Sexual Activity  .  Alcohol use: No    Comment: Non drinker/ No alcohol use.  . Drug use: No  . Sexual activity: Yes    Birth control/protection: Surgical  Other Topics Concern  . Not on file  Social History Narrative  . Not on file    Family History  Problem Relation Age of Onset  . Hypertension Mother   . Cancer Mother        Breast  . Breast cancer Mother 67  . Heart disease Father   . Hypertension Father   . Arthritis Sister   . Thyroid disease Brother   . Arthritis Sister        arthritis with bilateral hip replacent and multiple myeloma disease  . Bladder Cancer Sister   . Esophageal cancer Brother   . Stomach cancer Brother   . Melanoma Brother   . Bladder Cancer Brother   . Diabetes Paternal Grandfather     The following portions of the patient's history were reviewed and updated as appropriate: allergies, current medications, past family history, past medical  history, past social history, past surgical history and problem list.  Review of Systems ROS    Objective:   BP 138/86   Pulse 90   Ht 5' 8"  (1.727 m)   Wt 287 lb 4.8 oz (130.3 kg)   BMI 43.68 kg/m  CONSTITUTIONAL: Well-developed, well-nourished female in no acute distress.  PSYCHIATRIC: Normal mood and affect. Normal behavior. Normal judgment and thought content. [place]: Alert and oriented to person, place, and time. Normal muscle tone coordination. No cranial nerve deficit noted. HENT:  Normocephalic, atraumatic, External right and left ear normal. Oropharynx is clear and moist EYES: Conjunctivae and EOM are normal. No scleral icterus.  NECK: Normal range of motion, supple, no masses.  Normal thyroid.  SKIN: Skin is warm and dry. No rash noted. Not diaphoretic. No erythema. No pallor. CARDIOVASCULAR: Normal heart rate noted, regular rhythm, no murmur. RESPIRATORY: Clear to auscultation bilaterally. Effort and breath sounds normal, no problems with respiration noted. BREASTS: Symmetric in size. No masses, skin changes, nipple drainage, or lymphadenopathy. ABDOMEN: Soft, normal bowel sounds, no distention noted.  No tenderness, rebound or guarding.  BLADDER: Normal PELVIC:  External Genitalia: Normal  BUS: Normal  Vagina: Second-degree cystocele; good apex support; no rectocele  Cervix: Surgically absent  Uterus: Surgically absent  Adnexa: Normal  RV: External Exam NormaI, No Rectal Masses and Normal Sphincter tone  MUSCULOSKELETAL: Normal range of motion. No tenderness.  No cyanosis, clubbing, or edema.  2+ distal pulses. LYMPHATIC: No Axillary, Supraclavicular, or Inguinal Adenopathy.    Assessment:   Annual gynecologic examination 61 y.o. Contraception: status post hysterectomyLAVH with anterior, posterior colporrhaphy Obesity 1 Decreased libido, stable, off testosterone therapy at this time Recurrent second-degree cystocele, minimally symptomatic with urinary  frequency hourly during the day and nocturia times 2 at night  Plan:  Pap:Not needed Mammogram: Ordered Stool Guaiac Testing: colonoscopy due soon 04/2016 Labs: thru pcp Routine preventative health maintenance measures emphasized: Exercise/Diet/Weight control, Tobacco Warnings and Alcohol/Substance use risks Calcium with vitamin D supplementation daily is recommended Exercise and weight loss goal of 1 pound per month is encouraged Pessary use for cystocele is discussed but declined at this time Return to [place], CMA  Brayton Mars, MD   Note: This dictation was prepared with Dragon dictation along with smaller phrase technology. Any transcriptional errors that result from this process are unintentional.

## 2017-02-17 ENCOUNTER — Ambulatory Visit (INDEPENDENT_AMBULATORY_CARE_PROVIDER_SITE_OTHER): Payer: BLUE CROSS/BLUE SHIELD | Admitting: Obstetrics and Gynecology

## 2017-02-17 ENCOUNTER — Encounter: Payer: Self-pay | Admitting: Obstetrics and Gynecology

## 2017-02-17 VITALS — BP 138/86 | HR 90 | Ht 68.0 in | Wt 287.3 lb

## 2017-02-17 DIAGNOSIS — Z90710 Acquired absence of both cervix and uterus: Secondary | ICD-10-CM

## 2017-02-17 DIAGNOSIS — Z01419 Encounter for gynecological examination (general) (routine) without abnormal findings: Secondary | ICD-10-CM

## 2017-02-17 DIAGNOSIS — N898 Other specified noninflammatory disorders of vagina: Secondary | ICD-10-CM | POA: Diagnosis not present

## 2017-02-17 DIAGNOSIS — N951 Menopausal and female climacteric states: Secondary | ICD-10-CM | POA: Diagnosis not present

## 2017-02-17 DIAGNOSIS — N8111 Cystocele, midline: Secondary | ICD-10-CM | POA: Diagnosis not present

## 2017-02-17 DIAGNOSIS — Z1231 Encounter for screening mammogram for malignant neoplasm of breast: Secondary | ICD-10-CM | POA: Diagnosis not present

## 2017-02-17 DIAGNOSIS — Z1211 Encounter for screening for malignant neoplasm of colon: Secondary | ICD-10-CM

## 2017-02-17 NOTE — Patient Instructions (Signed)
1.  No Pap smear is done.  Paps no longer needed. 2.  Mammogram is ordered 3.  Referral for colonoscopy for colon cancer screening is made 4.  Screening labs are to be obtained through primary care-Dr. Rosanna Randy 5.  Continue with healthy eating, exercise, and control weight loss.  Weight loss goal this year is 15 pounds 6.  Continue with calcium and vitamin D supplementation daily-1200 mg calcium/800 international units of vitamin D 7.  Consider quarterly testosterone injections for decreased libido as discussed 8.  Return in 1 year for annual exam   Health Maintenance for Postmenopausal Women Menopause is a normal process in which your reproductive ability comes to an end. This process happens gradually over a span of months to years, usually between the ages of 68 and 59. Menopause is complete when you have missed 12 consecutive menstrual periods. It is important to talk with your health care provider about some of the most common conditions that affect postmenopausal women, such as heart disease, cancer, and bone loss (osteoporosis). Adopting a healthy lifestyle and getting preventive care can help to promote your health and wellness. Those actions can also lower your chances of developing some of these common conditions. What should I know about menopause? During menopause, you may experience a number of symptoms, such as:  Moderate-to-severe hot flashes.  Night sweats.  Decrease in sex drive.  Mood swings.  Headaches.  Tiredness.  Irritability.  Memory problems.  Insomnia.  Choosing to treat or not to treat menopausal changes is an individual decision that you make with your health care provider. What should I know about hormone replacement therapy and supplements? Hormone therapy products are effective for treating symptoms that are associated with menopause, such as hot flashes and night sweats. Hormone replacement carries certain risks, especially as you become older. If you  are thinking about using estrogen or estrogen with progestin treatments, discuss the benefits and risks with your health care provider. What should I know about heart disease and stroke? Heart disease, heart attack, and stroke become more likely as you age. This may be due, in part, to the hormonal changes that your body experiences during menopause. These can affect how your body processes dietary fats, triglycerides, and cholesterol. Heart attack and stroke are both medical emergencies. There are many things that you can do to help prevent heart disease and stroke:  Have your blood pressure checked at least every 1-2 years. High blood pressure causes heart disease and increases the risk of stroke.  If you are 16-56 years old, ask your health care provider if you should take aspirin to prevent a heart attack or a stroke.  Do not use any tobacco products, including cigarettes, chewing tobacco, or electronic cigarettes. If you need help quitting, ask your health care provider.  It is important to eat a healthy diet and maintain a healthy weight. ? Be sure to include plenty of vegetables, fruits, low-fat dairy products, and lean protein. ? Avoid eating foods that are high in solid fats, added sugars, or salt (sodium).  Get regular exercise. This is one of the most important things that you can do for your health. ? Try to exercise for at least 150 minutes each week. The type of exercise that you do should increase your heart rate and make you sweat. This is known as moderate-intensity exercise. ? Try to do strengthening exercises at least twice each week. Do these in addition to the moderate-intensity exercise.  Know your numbers.Ask your  health care provider to check your cholesterol and your blood glucose. Continue to have your blood tested as directed by your health care provider.  What should I know about cancer screening? There are several types of cancer. Take the following steps to reduce  your risk and to catch any cancer development as early as possible. Breast Cancer  Practice breast self-awareness. ? This means understanding how your breasts normally appear and feel. ? It also means doing regular breast self-exams. Let your health care provider know about any changes, no matter how small.  If you are 50 or older, have a clinician do a breast exam (clinical breast exam or CBE) every year. Depending on your age, family history, and medical history, it may be recommended that you also have a yearly breast X-ray (mammogram).  If you have a family history of breast cancer, talk with your health care provider about genetic screening.  If you are at high risk for breast cancer, talk with your health care provider about having an MRI and a mammogram every year.  Breast cancer (BRCA) gene test is recommended for women who have family members with BRCA-related cancers. Results of the assessment will determine the need for genetic counseling and BRCA1 and for BRCA2 testing. BRCA-related cancers include these types: ? Breast. This occurs in males or females. ? Ovarian. ? Tubal. This may also be called fallopian tube cancer. ? Cancer of the abdominal or pelvic lining (peritoneal cancer). ? Prostate. ? Pancreatic.  Cervical, Uterine, and Ovarian Cancer Your health care provider may recommend that you be screened regularly for cancer of the pelvic organs. These include your ovaries, uterus, and vagina. This screening involves a pelvic exam, which includes checking for microscopic changes to the surface of your cervix (Pap test).  For women ages 21-65, health care providers may recommend a pelvic exam and a Pap test every three years. For women ages 73-65, they may recommend the Pap test and pelvic exam, combined with testing for human papilloma virus (HPV), every five years. Some types of HPV increase your risk of cervical cancer. Testing for HPV may also be done on women of any age who  have unclear Pap test results.  Other health care providers may not recommend any screening for nonpregnant women who are considered low risk for pelvic cancer and have no symptoms. Ask your health care provider if a screening pelvic exam is right for you.  If you have had past treatment for cervical cancer or a condition that could lead to cancer, you need Pap tests and screening for cancer for at least 20 years after your treatment. If Pap tests have been discontinued for you, your risk factors (such as having a new sexual partner) need to be reassessed to determine if you should start having screenings again. Some women have medical problems that increase the chance of getting cervical cancer. In these cases, your health care provider may recommend that you have screening and Pap tests more often.  If you have a family history of uterine cancer or ovarian cancer, talk with your health care provider about genetic screening.  If you have vaginal bleeding after reaching menopause, tell your health care provider.  There are currently no reliable tests available to screen for ovarian cancer.  Lung Cancer Lung cancer screening is recommended for adults 26-40 years old who are at high risk for lung cancer because of a history of smoking. A yearly low-dose CT scan of the lungs is recommended if  you:  Currently smoke.  Have a history of at least 30 pack-years of smoking and you currently smoke or have quit within the past 15 years. A pack-year is smoking an average of one pack of cigarettes per day for one year.  Yearly screening should:  Continue until it has been 15 years since you quit.  Stop if you develop a health problem that would prevent you from having lung cancer treatment.  Colorectal Cancer  This type of cancer can be detected and can often be prevented.  Routine colorectal cancer screening usually begins at age 56 and continues through age 75.  If you have risk factors for colon  cancer, your health care provider may recommend that you be screened at an earlier age.  If you have a family history of colorectal cancer, talk with your health care provider about genetic screening.  Your health care provider may also recommend using home test kits to check for hidden blood in your stool.  A small camera at the end of a tube can be used to examine your colon directly (sigmoidoscopy or colonoscopy). This is done to check for the earliest forms of colorectal cancer.  Direct examination of the colon should be repeated every 5-10 years until age 57. However, if early forms of precancerous polyps or small growths are found or if you have a family history or genetic risk for colorectal cancer, you may need to be screened more often.  Skin Cancer  Check your skin from head to toe regularly.  Monitor any moles. Be sure to tell your health care provider: ? About any new moles or changes in moles, especially if there is a change in a mole's shape or color. ? If you have a mole that is larger than the size of a pencil eraser.  If any of your family members has a history of skin cancer, especially at a young age, talk with your health care provider about genetic screening.  Always use sunscreen. Apply sunscreen liberally and repeatedly throughout the day.  Whenever you are outside, protect yourself by wearing long sleeves, pants, a wide-brimmed hat, and sunglasses.  What should I know about osteoporosis? Osteoporosis is a condition in which bone destruction happens more quickly than new bone creation. After menopause, you may be at an increased risk for osteoporosis. To help prevent osteoporosis or the bone fractures that can happen because of osteoporosis, the following is recommended:  If you are 84-21 years old, get at least 1,000 mg of calcium and at least 600 mg of vitamin D per day.  If you are older than age 62 but younger than age 56, get at least 1,200 mg of calcium and  at least 600 mg of vitamin D per day.  If you are older than age 77, get at least 1,200 mg of calcium and at least 800 mg of vitamin D per day.  Smoking and excessive alcohol intake increase the risk of osteoporosis. Eat foods that are rich in calcium and vitamin D, and do weight-bearing exercises several times each week as directed by your health care provider. What should I know about how menopause affects my mental health? Depression may occur at any age, but it is more common as you become older. Common symptoms of depression include:  Low or sad mood.  Changes in sleep patterns.  Changes in appetite or eating patterns.  Feeling an overall lack of motivation or enjoyment of activities that you previously enjoyed.  Frequent crying  spells.  Talk with your health care provider if you think that you are experiencing depression. What should I know about immunizations? It is important that you get and maintain your immunizations. These include:  Tetanus, diphtheria, and pertussis (Tdap) booster vaccine.  Influenza every year before the flu season begins.  Pneumonia vaccine.  Shingles vaccine.  Your health care provider may also recommend other immunizations. This information is not intended to replace advice given to you by your health care provider. Make sure you discuss any questions you have with your health care provider. Document Released: 05/14/2005 Document Revised: 10/10/2015 Document Reviewed: 12/24/2014 Elsevier Interactive Patient Education  2018 [employer].

## 2017-02-22 ENCOUNTER — Encounter: Payer: Self-pay | Admitting: Obstetrics and Gynecology

## 2017-03-03 ENCOUNTER — Telehealth: Payer: Self-pay | Admitting: Gastroenterology

## 2017-03-03 ENCOUNTER — Other Ambulatory Visit: Payer: Self-pay

## 2017-03-03 ENCOUNTER — Encounter: Payer: Self-pay | Admitting: Obstetrics and Gynecology

## 2017-03-03 DIAGNOSIS — Z1211 Encounter for screening for malignant neoplasm of colon: Secondary | ICD-10-CM

## 2017-03-03 NOTE — Telephone Encounter (Signed)
Patients dr office called and stated this patient would like to have colonoscopy before the end of the year. Can we call her ASAP?

## 2017-03-03 NOTE — Telephone Encounter (Signed)
Gastroenterology Pre-Procedure Review  Request Date:  Requesting Physician: Dr.   PATIENT REVIEW QUESTIONS: The patient responded to the following health history questions as indicated:    1. Are you having any GI issues? no 2. Do you have a personal history of Polyps? no 3. Do you have a family history of Colon Cancer or Polyps? yes (Grandfather) 4. Diabetes Mellitus? no 5. Joint replacements in the past 12 months?no 6. Major health problems in the past 3 months?no 7. Any artificial heart valves, MVP, or defibrillator?no    MEDICATIONS & ALLERGIES:    Patient reports the following regarding taking any anticoagulation/antiplatelet therapy:   Plavix, Coumadin, Eliquis, Xarelto, Lovenox, Pradaxa, Brilinta, or Effient? no Aspirin? yes (ASA 81mg )  Patient confirms/reports the following medications:  Current Outpatient Medications  Medication Sig Dispense Refill  . albuterol (PROVENTIL HFA;VENTOLIN HFA) 108 (90 Base) MCG/ACT inhaler Inhale 2 puffs every 6 (six) hours as needed into the lungs for wheezing or shortness of breath. 1 Inhaler 0  . ALPRAZolam (XANAX) 0.5 MG tablet 1 to 2 tablets daily as needed 60 tablet 3  . aspirin EC 81 MG tablet Take 1 tablet (81 mg total) by mouth daily. 30 tablet 12   No current facility-administered medications for this visit.     Patient confirms/reports the following allergies:  Allergies  Allergen Reactions  . Oxycodone Rash    No orders of the defined types were placed in this encounter.   AUTHORIZATION INFORMATION Primary Insurance: 1D#: Group #:  Secondary Insurance: 1D#: Group #:  SCHEDULE INFORMATION: Date: 03/15/17 Time: Location: [facility]

## 2017-03-03 NOTE — Telephone Encounter (Signed)
LVM for pt to return my call.

## 2017-03-07 ENCOUNTER — Ambulatory Visit (INDEPENDENT_AMBULATORY_CARE_PROVIDER_SITE_OTHER): Payer: BLUE CROSS/BLUE SHIELD | Admitting: Family Medicine

## 2017-03-07 DIAGNOSIS — Z23 Encounter for immunization: Secondary | ICD-10-CM | POA: Diagnosis not present

## 2017-03-10 ENCOUNTER — Telehealth: Payer: Self-pay | Admitting: Gastroenterology

## 2017-03-10 ENCOUNTER — Other Ambulatory Visit: Payer: Self-pay

## 2017-03-10 MED ORDER — NA SULFATE-K SULFATE-MG SULF 17.5-3.13-1.6 GM/177ML PO SOLN: 1.0000 | ORAL | 0 refills | Status: DC

## 2017-03-10 NOTE — Telephone Encounter (Signed)
Paperwork submitted through [employer] and rx faxed to [facility]. Pt aware.

## 2017-03-10 NOTE — Telephone Encounter (Signed)
Patient left a voice message that she hasn't received her packet. Please call

## 2017-03-15 ENCOUNTER — Telehealth: Payer: Self-pay | Admitting: Gastroenterology

## 2017-03-15 NOTE — Telephone Encounter (Signed)
Patient needs to reschedule colonoscopy. Prep was to expensive. Please call

## 2017-03-18 NOTE — Telephone Encounter (Signed)
Pt has been rescheduled to January. Left vm with [facility] that the request for a cheaper bowel prep was noted on the rx that was faxed over.

## 2017-04-04 ENCOUNTER — Ambulatory Visit
Admission: RE | Admit: 2017-04-04 | Discharge: 2017-04-04 | Disposition: A | Discharge status: Home / Self Care | Payer: BLUE CROSS/BLUE SHIELD | Source: Ambulatory Visit | Attending: Obstetrics and Gynecology | Admitting: Obstetrics and Gynecology

## 2017-04-04 DIAGNOSIS — Z01419 Encounter for gynecological examination (general) (routine) without abnormal findings: Secondary | ICD-10-CM

## 2017-04-04 DIAGNOSIS — Z1231 Encounter for screening mammogram for malignant neoplasm of breast: Secondary | ICD-10-CM | POA: Diagnosis present

## 2017-04-07 ENCOUNTER — Other Ambulatory Visit: Payer: Self-pay | Admitting: Obstetrics and Gynecology

## 2017-04-07 DIAGNOSIS — R928 Other abnormal and inconclusive findings on diagnostic imaging of breast: Secondary | ICD-10-CM

## 2017-04-07 DIAGNOSIS — N6310 Unspecified lump in the right breast, unspecified quadrant: Secondary | ICD-10-CM

## 2017-04-12 ENCOUNTER — Ambulatory Visit (INDEPENDENT_AMBULATORY_CARE_PROVIDER_SITE_OTHER): Payer: BLUE CROSS/BLUE SHIELD | Admitting: Family Medicine

## 2017-04-12 ENCOUNTER — Other Ambulatory Visit: Payer: Self-pay

## 2017-04-12 VITALS — BP 122/84 | HR 80 | Temp 98.0°F | Resp 16 | Wt 287.0 lb

## 2017-04-12 DIAGNOSIS — Z0000 Encounter for general adult medical examination without abnormal findings: Secondary | ICD-10-CM | POA: Diagnosis not present

## 2017-04-12 DIAGNOSIS — F419 Anxiety disorder, unspecified: Secondary | ICD-10-CM | POA: Diagnosis not present

## 2017-04-12 MED ORDER — ALPRAZOLAM 0.5 MG PO TABS: ORAL_TABLET | ORAL | 3 refills | Status: DC

## 2017-04-12 NOTE — Progress Notes (Signed)
Patient: Andrea Beard, Female    DOB: [birth year], 62 y.o.   MRN: [id number] Visit Date: 04/12/2017  Today's Provider: Wilhemena Durie, MD   Chief Complaint  Patient presents with  . Annual Exam   Subjective:  Andrea Beard is a 62 y.o. female who presents today for health maintenance and complete physical. She feels well. She reports exercising twice weekly. She reports she is sleeping well.   03/24/11 Colonoscopy-internal hemorrhoids, repeat 5 years.  Patient has scheduled for 04/19/17  11/14/12 Hysterectomy, complete for fibroids, cervicitis and leiomyoma.  Negative for hyperplasia or carcinoma per path report under labs.  04/04/17 Mammogram-Category (0 )-scheduled for additional views on 04/14/17   Review of Systems  Constitutional: Negative.   HENT: Negative.   Eyes: Negative.   Respiratory: Negative.   Cardiovascular: Negative.   Gastrointestinal: Negative.   Endocrine: Negative.   Genitourinary: Negative.   Musculoskeletal: Positive for arthralgias.  Skin: Negative.   Allergic/Immunologic: Negative.   Neurological: Negative.   Hematological: Negative.   Psychiatric/Behavioral: Negative.     Social History   Socioeconomic History  . Marital status: Married    Spouse name: Not on file  . Number of children: Not on file  . Years of education: Not on file  . Highest education level: Not on file  Social Needs  . Financial resource strain: Not on file  . Food insecurity - worry: Not on file  . Food insecurity - inability: Not on file  . Transportation needs - medical: Not on file  . Transportation needs - non-medical: Not on file  Occupational History  . Not on file  Tobacco Use  . Smoking status: Never Smoker  . Smokeless tobacco: Never Used  Substance and Sexual Activity  . Alcohol use: No    Comment: Non drinker/ No alcohol use.  . Drug use: No  . Sexual activity: Yes    Birth control/protection: Surgical  Other Topics Concern  . Not on file   Social History Narrative  . Not on file    Patient Active Problem List   Diagnosis Date Noted  . Chest pain 06/24/2015  . Combined fat and carbohydrate induced hyperlipemia 06/24/2015  . MI (mitral incompetence) 06/24/2015  . 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 Cystocele, midline 02/12/2015  . Status post laparoscopic assisted vaginal hysterectomy (LAVH) 02/12/2015  . Adenomyosis 02/12/2015  . Abdominal pain, left upper quadrant 11/22/2014  . Abnormal blood level of cobalt 11/22/2014  . Anxiety 11/22/2014  . Arthropathia 11/22/2014  . Decreased libido 11/22/2014  . Clinical depression 11/22/2014  . Fibroid 11/22/2014  . Borderline diabetes 11/22/2014  . History of repair of hip joint 11/22/2014  . Cardiac murmur 11/22/2014  . Titanium poisoning 11/22/2014  . Arthralgia of hip 11/22/2014  . Decreased potassium in the blood 11/22/2014  . Asymptomatic postmenopausal status 11/22/2014  . Extreme obesity 11/22/2014  . Cramp of limb 11/22/2014  . Bacterial upper respiratory infection 11/22/2014  . Screening examination for poliomyelitis 11/22/2014  . Foot tendinitis 11/22/2014  . Head revolving around 11/22/2014  . Menopause 11/22/2014  . Idiopathic localized osteoarthropathy 10/02/2012    Past Surgical History:  Procedure Laterality Date  . ABDOMINAL HYSTERECTOMY     Dr. Quenten Raven  . BREAST BIOPSY Right    negative  . Left Hip raplacement  Left 2008 and right 2010    Her family history includes Arthritis in her sister and sister; Bladder Cancer in her brother and brother; Breast cancer (age of onset: 28) in  her mother; Cancer in her mother; Diabetes in her paternal grandfather; Esophageal cancer in her brother; Heart disease in her father; Hypertension in her father and mother; Melanoma in her brother; Stomach cancer in her brother; Thyroid disease in her brother.     Outpatient Encounter Medications as of 04/12/2017  Medication Sig  . ALPRAZolam (XANAX) 0.5 MG tablet 1 to  2 tablets daily as needed  . aspirin EC 81 MG tablet Take 1 tablet (81 mg total) by mouth daily.  . calcium carbonate (OSCAL) 1500 (600 Ca) MG TABS tablet Take by mouth 2 (two) times daily with a meal.  . Na Sulfate-K Sulfate-Mg Sulf (SUPREP BOWEL PREP KIT) 17.5-3.13-1.6 GM/177ML SOLN Take 1 kit by mouth as directed.  . [DISCONTINUED] albuterol (PROVENTIL HFA;VENTOLIN HFA) 108 (90 Base) MCG/ACT inhaler Inhale 2 puffs every 6 (six) hours as needed into the lungs for wheezing or shortness of breath.   No facility-administered encounter medications on file as of 04/12/2017.     Patient Care Team: Jerrol Banana., MD as PCP - General (Family Medicine)      Objective:   Vitals:  Vitals:   04/12/17 0940  BP: 122/84  Pulse: 80  Resp: 16  Temp: 98 F (36.7 C)  TempSrc: Oral  Weight: 287 lb (130.2 kg)    Physical Exam  Constitutional: She is oriented to person, place, and time. She appears well-developed and well-nourished.  HENT:  Head: Normocephalic and atraumatic.  Right Ear: External ear normal.  Left Ear: External ear normal.  Nose: Nose normal.  Mouth/Throat: Oropharynx is clear and moist.  Eyes: Conjunctivae and EOM are normal. Pupils are equal, round, and reactive to light.  Neck: Normal range of motion. Neck supple.  Cardiovascular: Normal rate, regular rhythm, normal heart sounds and intact distal pulses.  Pulmonary/Chest: Effort normal and breath sounds normal.  Abdominal: Soft. Bowel sounds are normal.  Musculoskeletal: Normal range of motion.  Neurological: She is alert and oriented to person, place, and time.  Skin: Skin is warm and dry.  Psychiatric: She has a normal mood and affect. Her behavior is normal. Judgment and thought content normal.   Fall Risk  04/12/2017 02/25/2015  Falls in the past year? No No   Depression Screen PHQ 2/9 Scores 04/12/2017 04/08/2016 02/25/2015  PHQ - 2 Score 1 0 0   Home Exercise  04/12/2017  Current Exercise Habits Structured  exercise class  Type of exercise (No Data)  Frequency (Times/Week) 2   Functional Status Survey: Does the patient have difficulty seeing, even when wearing glasses/contacts?: No Does the patient have difficulty concentrating, remembering, or making decisions?: No Does the patient have difficulty walking or climbing stairs?: Yes Does the patient have difficulty dressing or bathing?: No Does the patient have difficulty doing errands alone such as visiting a doctor's office or shopping?: No   Assessment & Plan:     Routine Health Maintenance and Physical Exam  Exercise Activities and Dietary recommendations Goals    None      Immunization History  Administered Date(s) Administered  . Influenza,inj,Quad PF,6+ Mos 01/10/2015, 01/10/2016, 03/07/2017  . Influenza-Unspecified 01/11/2013, 01/12/2014  . Rabies, IM 01/21/2017, 01/24/2017, 01/28/2017, 02/04/2017  . Tdap 06/29/2007    Health Maintenance  Topic Date Due  . HIV Screening  03/20/1973  . PAP SMEAR  10/27/2013  . TETANUS/TDAP  06/28/2017  . MAMMOGRAM  04/05/2019  . COLONOSCOPY  03/23/2021  . INFLUENZA VACCINE  Completed  . Hepatitis C Screening  Completed  Breast /Pelvic/DRE per Gynecology. Discussed health benefits of physical activity, and encouraged her to engage in regular exercise appropriate for her age and condition.  OA of feet Adjustment Reaction Obesity   I have done the exam and reviewed the chart and it is accurate to the best of my knowledge. Development worker, community has been used and  any errors in dictation or transcription are unintentional. Miguel Aschoff M.D. [facility]

## 2017-04-13 LAB — COMPREHENSIVE METABOLIC PANEL
A/G RATIO: 1.6 (ref 1.2–2.2)
ALK PHOS: 97 IU/L (ref 39–117)
ALT: 15 IU/L (ref 0–32)
AST: 17 IU/L (ref 0–40)
Albumin: 4.4 g/dL (ref 3.5–5.5)
BILIRUBIN TOTAL: 0.2 mg/dL (ref 0.0–1.2)
BUN/Creatinine Ratio: 23 (ref 9–23)
BUN: 13 mg/dL (ref 6–24)
CHLORIDE: 103 mmol/L (ref 96–106)
CO2: 23 mmol/L (ref 20–29)
Calcium: 9.7 mg/dL (ref 8.7–10.2)
Creatinine, Ser: 0.56 mg/dL — ABNORMAL LOW (ref 0.57–1.00)
GFR calc Af Amer: 118 mL/min/{1.73_m2} (ref >59)
GFR calc non Af Amer: 102 mL/min/{1.73_m2} (ref >59)
GLUCOSE: 100 mg/dL — AB (ref 65–99)
Globulin, Total: 2.8 g/dL (ref 1.5–4.5)
POTASSIUM: 4.7 mmol/L (ref 3.5–5.2)
Sodium: 143 mmol/L (ref 134–144)
TOTAL PROTEIN: 7.2 g/dL (ref 6.0–8.5)

## 2017-04-13 LAB — CBC WITH DIFFERENTIAL/PLATELET
BASOS ABS: 0 10*3/uL (ref 0.0–0.2)
BASOS: 1 %
EOS (ABSOLUTE): 0.1 10*3/uL (ref 0.0–0.4)
Eos: 2 %
Hematocrit: 42.4 % (ref 34.0–46.6)
Hemoglobin: 13.9 g/dL (ref 11.1–15.9)
IMMATURE GRANS (ABS): 0 10*3/uL (ref 0.0–0.1)
Immature Granulocytes: 0 %
LYMPHS: 33 %
Lymphocytes Absolute: 2.4 10*3/uL (ref 0.7–3.1)
MCH: 29.8 pg (ref 26.6–33.0)
MCHC: 32.8 g/dL (ref 31.5–35.7)
MCV: 91 fL (ref 79–97)
MONOS ABS: 0.7 10*3/uL (ref 0.1–0.9)
Monocytes: 9 %
Neutrophils Absolute: 4 10*3/uL (ref 1.4–7.0)
Neutrophils: 55 %
PLATELETS: 349 10*3/uL (ref 150–379)
RBC: 4.67 x10E6/uL (ref 3.77–5.28)
RDW: 14.8 % (ref 12.3–15.4)
WBC: 7.2 10*3/uL (ref 3.4–10.8)

## 2017-04-13 LAB — LIPID PANEL WITH LDL/HDL RATIO
CHOLESTEROL TOTAL: 196 mg/dL (ref 100–199)
HDL: 63 mg/dL (ref >39)
LDL Calculated: 118 mg/dL — ABNORMAL HIGH (ref 0–99)
LDl/HDL Ratio: 1.9 ratio (ref 0.0–3.2)
Triglycerides: 73 mg/dL (ref 0–149)
VLDL Cholesterol Cal: 15 mg/dL (ref 5–40)

## 2017-04-13 LAB — TSH: TSH: 1.26 u[IU]/mL (ref 0.450–4.500)

## 2017-04-14 ENCOUNTER — Ambulatory Visit
Admission: RE | Admit: 2017-04-14 | Discharge: 2017-04-14 | Disposition: A | Discharge status: Home / Self Care | Payer: BLUE CROSS/BLUE SHIELD | Source: Ambulatory Visit | Attending: Obstetrics and Gynecology | Admitting: Obstetrics and Gynecology

## 2017-04-14 ENCOUNTER — Telehealth: Payer: Self-pay | Admitting: Emergency Medicine

## 2017-04-14 DIAGNOSIS — N6001 Solitary cyst of right breast: Secondary | ICD-10-CM | POA: Insufficient documentation

## 2017-04-14 DIAGNOSIS — N6310 Unspecified lump in the right breast, unspecified quadrant: Secondary | ICD-10-CM

## 2017-04-14 DIAGNOSIS — R928 Other abnormal and inconclusive findings on diagnostic imaging of breast: Secondary | ICD-10-CM | POA: Diagnosis present

## 2017-04-14 NOTE — Telephone Encounter (Signed)
Pt advised.   Thanks,   -Maleny Candy  

## 2017-04-14 NOTE — Telephone Encounter (Signed)
Tried to call pt . No answer.  

## 2017-04-14 NOTE — Telephone Encounter (Signed)
-----   Message from Jerrol Banana., MD sent at 04/13/2017  4:49 PM EST ----- Labs OK.

## 2017-04-19 ENCOUNTER — Other Ambulatory Visit: Payer: Self-pay

## 2017-04-19 ENCOUNTER — Encounter
Admission: RE | Disposition: A | Discharge status: Home / Self Care | Payer: Self-pay | Source: Ambulatory Visit | Attending: Gastroenterology

## 2017-04-19 ENCOUNTER — Ambulatory Visit: Payer: BLUE CROSS/BLUE SHIELD | Admitting: Certified Registered Nurse Anesthetist

## 2017-04-19 ENCOUNTER — Ambulatory Visit
Admission: RE | Admit: 2017-04-19 | Discharge: 2017-04-19 | Disposition: A | Discharge status: Home / Self Care | Payer: BLUE CROSS/BLUE SHIELD | Source: Ambulatory Visit | Attending: Gastroenterology | Admitting: Gastroenterology

## 2017-04-19 ENCOUNTER — Encounter: Payer: Self-pay | Admitting: *Deleted

## 2017-04-19 DIAGNOSIS — M1990 Unspecified osteoarthritis, unspecified site: Secondary | ICD-10-CM | POA: Diagnosis not present

## 2017-04-19 DIAGNOSIS — F419 Anxiety disorder, unspecified: Secondary | ICD-10-CM | POA: Diagnosis not present

## 2017-04-19 DIAGNOSIS — Z85828 Personal history of other malignant neoplasm of skin: Secondary | ICD-10-CM | POA: Diagnosis not present

## 2017-04-19 DIAGNOSIS — Z800 Family history of malignant neoplasm of digestive organs: Secondary | ICD-10-CM | POA: Diagnosis not present

## 2017-04-19 DIAGNOSIS — Z1211 Encounter for screening for malignant neoplasm of colon: Secondary | ICD-10-CM | POA: Diagnosis present

## 2017-04-19 DIAGNOSIS — Z96643 Presence of artificial hip joint, bilateral: Secondary | ICD-10-CM | POA: Insufficient documentation

## 2017-04-19 DIAGNOSIS — F329 Major depressive disorder, single episode, unspecified: Secondary | ICD-10-CM | POA: Insufficient documentation

## 2017-04-19 DIAGNOSIS — Z885 Allergy status to narcotic agent status: Secondary | ICD-10-CM | POA: Diagnosis not present

## 2017-04-19 DIAGNOSIS — K635 Polyp of colon: Secondary | ICD-10-CM | POA: Insufficient documentation

## 2017-04-19 DIAGNOSIS — Z79899 Other long term (current) drug therapy: Secondary | ICD-10-CM | POA: Diagnosis not present

## 2017-04-19 DIAGNOSIS — K640 First degree hemorrhoids: Secondary | ICD-10-CM | POA: Insufficient documentation

## 2017-04-19 DIAGNOSIS — R011 Cardiac murmur, unspecified: Secondary | ICD-10-CM | POA: Insufficient documentation

## 2017-04-19 DIAGNOSIS — Z7982 Long term (current) use of aspirin: Secondary | ICD-10-CM | POA: Diagnosis not present

## 2017-04-19 DIAGNOSIS — D124 Benign neoplasm of descending colon: Secondary | ICD-10-CM | POA: Diagnosis not present

## 2017-04-19 DIAGNOSIS — Z6841 Body Mass Index (BMI) 40.0 and over, adult: Secondary | ICD-10-CM | POA: Insufficient documentation

## 2017-04-19 DIAGNOSIS — Z8249 Family history of ischemic heart disease and other diseases of the circulatory system: Secondary | ICD-10-CM | POA: Insufficient documentation

## 2017-04-19 HISTORY — PX: COLONOSCOPY WITH PROPOFOL: SHX5780

## 2017-04-19 SURGERY — COLONOSCOPY WITH PROPOFOL
Anesthesia: General

## 2017-04-19 MED ORDER — PROPOFOL 500 MG/50ML IV EMUL
INTRAVENOUS | Status: DC | PRN
  Administered 2017-04-19: 150 ug/kg/min via INTRAVENOUS

## 2017-04-19 MED ORDER — LIDOCAINE HCL (CARDIAC) 20 MG/ML IV SOLN
INTRAVENOUS | Status: DC | PRN
  Administered 2017-04-19: 50 mg via INTRAVENOUS

## 2017-04-19 MED ORDER — PROPOFOL 500 MG/50ML IV EMUL
INTRAVENOUS | Status: AC
  Filled 2017-04-19: qty 50

## 2017-04-19 MED ORDER — PROPOFOL 10 MG/ML IV BOLUS
INTRAVENOUS | Status: DC | PRN
  Administered 2017-04-19: 30 mg via INTRAVENOUS
  Administered 2017-04-19 (×3): 20 mg via INTRAVENOUS
  Administered 2017-04-19: 40 mg via INTRAVENOUS

## 2017-04-19 MED ORDER — SODIUM CHLORIDE 0.9 % IV SOLN
INTRAVENOUS | Status: DC
  Administered 2017-04-19: 07:00:00 via INTRAVENOUS

## 2017-04-19 MED ORDER — PROPOFOL 10 MG/ML IV BOLUS
INTRAVENOUS | Status: AC
  Filled 2017-04-19: qty 20

## 2017-04-19 MED ORDER — LIDOCAINE HCL (PF) 2 % IJ SOLN
INTRAMUSCULAR | Status: AC
  Filled 2017-04-19: qty 10

## 2017-04-19 NOTE — Transfer of Care (Signed)
Immediate Anesthesia Transfer of Care Note  Patient: Andrea Beard  Procedure(s) Performed: COLONOSCOPY WITH PROPOFOL (N/A )  Patient Location: PACU  Anesthesia Type:General  Level of Consciousness: awake, alert  and oriented  Airway & Oxygen Therapy: Patient Spontanous Breathing and Patient connected to nasal cannula oxygen  Post-op Assessment: Report given to RN and Post -op Vital signs reviewed and stable  Post vital signs: Reviewed and stable  Last Vitals:  Vitals:   04/19/17 0804 04/19/17 0806  BP:  (!) 89/53  Pulse:  87  Resp:  19  Temp: (!) 36 C (!) 36 C  SpO2:  97%    Last Pain:  Vitals:   04/19/17 0806  TempSrc: Tympanic  PainSc:          Complications: No apparent anesthesia complications

## 2017-04-19 NOTE — Anesthesia Preprocedure Evaluation (Signed)
Anesthesia Evaluation  Patient identified by MRN, date of birth, ID band Patient awake    Reviewed: Allergy & Precautions, H&P , NPO status , Patient's Chart, lab work & pertinent test results, reviewed documented beta blocker date and time   History of Anesthesia Complications Negative for: history of anesthetic complications  Airway Mallampati: II  TM Distance: >3 FB Neck ROM: full    Dental   Pulmonary neg pulmonary ROS,           Cardiovascular Exercise Tolerance: Good (-) hypertension(-) angina(-) CAD, (-) Past MI, (-) Cardiac Stents and (-) CABG (-) dysrhythmias + Valvular Problems/Murmurs      Neuro/Psych PSYCHIATRIC DISORDERS negative neurological ROS     GI/Hepatic negative GI ROS, Neg liver ROS,   Endo/Other  neg diabetesMorbid obesity  Renal/GU negative Renal ROS  negative genitourinary   Musculoskeletal   Abdominal   Peds  Hematology negative hematology ROS (+)   Anesthesia Other Findings Past Medical History: No date: Anxiety No date: Arthritis No date: Arthropathy No date: Cancer ([place])     Comment:  skin No date: Depression No date: H/O bilateral hip replacements No date: Hypopotassemia No date: Morbid obesity ([place])   Reproductive/Obstetrics negative OB ROS                             Anesthesia Physical Anesthesia Plan  ASA: III  Anesthesia Plan: General   Post-op Pain Management:    Induction: Intravenous  PONV Risk Score and Plan: 3 and Propofol infusion  Airway Management Planned: Nasal Cannula  Additional Equipment:   Intra-op Plan:   Post-operative Plan:   Informed Consent: I have reviewed the patients History and Physical, chart, labs and discussed the procedure including the risks, benefits and alternatives for the proposed anesthesia with the patient or authorized representative who has indicated his/her understanding and acceptance.   Dental  Advisory Given  Plan Discussed with: Anesthesiologist, CRNA and Surgeon  Anesthesia Plan Comments:         Anesthesia Quick Evaluation

## 2017-04-19 NOTE — Anesthesia Post-op Follow-up Note (Signed)
Anesthesia QCDR form completed.        

## 2017-04-19 NOTE — Anesthesia Procedure Notes (Signed)
Date/Time: 04/19/2017 7:39 AM Performed by: Johnna Acosta, CRNA Pre-anesthesia Checklist: Patient identified, Emergency Drugs available, Suction available, Patient being monitored and Timeout performed Patient Re-evaluated:Patient Re-evaluated prior to induction Oxygen Delivery Method: Nasal cannula Preoxygenation: Pre-oxygenation with 100% oxygen

## 2017-04-19 NOTE — Anesthesia Postprocedure Evaluation (Signed)
Anesthesia Post Note  Patient: Andrea Beard  Procedure(s) Performed: COLONOSCOPY WITH PROPOFOL (N/A )  Patient location during evaluation: Endoscopy Anesthesia Type: General Level of consciousness: awake and alert Pain management: pain level controlled Vital Signs Assessment: post-procedure vital signs reviewed and stable Respiratory status: spontaneous breathing, nonlabored ventilation, respiratory function stable and patient connected to nasal cannula oxygen Cardiovascular status: blood pressure returned to baseline and stable Postop Assessment: no apparent nausea or vomiting Anesthetic complications: no     Last Vitals:  Vitals:   04/19/17 0824 04/19/17 0834  BP: 121/84 123/84  Pulse:    Resp:    Temp:    SpO2:      Last Pain:  Vitals:   04/19/17 0806  TempSrc: Tympanic  PainSc:                  Martha Clan

## 2017-04-19 NOTE — Op Note (Signed)
[facility] Patient Name: Andrea Beard Procedure Date: 04/19/2017 7:22 AM MRN: [id number] Account #: [id number] Date of Birth: 1955/06/10 Admit Type: Outpatient Age: 62 Room: [facility] ENDO ROOM 4 Gender: Female Note Status: Finalized Procedure:            Colonoscopy Indications:          Family history of colon cancer Providers:            Lucilla Lame MD, MD Medicines:            Propofol per Anesthesia Complications:        No immediate complications. Procedure:            Pre-Anesthesia Assessment:                       - Prior to the procedure, a History and Physical was                        performed, and patient medications and allergies were                        reviewed. The patient's tolerance of previous                        anesthesia was also reviewed. The risks and benefits of                        the procedure and the sedation options and risks were                        discussed with the patient. All questions were                        answered, and informed consent was obtained. Prior                        Anticoagulants: The patient has taken no previous                        anticoagulant or antiplatelet agents. ASA Grade                        Assessment: II - A patient with mild systemic disease.                        After reviewing the risks and benefits, the patient was                        deemed in satisfactory condition to undergo the                        procedure.                       After obtaining informed consent, the colonoscope was                        passed under direct vision. Throughout the procedure,                        the patient's blood pressure,  pulse, and oxygen                        saturations were monitored continuously. The                        Colonoscope was introduced through the anus and                        advanced to the the cecum, identified by appendiceal                         orifice and ileocecal valve. The colonoscopy was                        performed without difficulty. The patient tolerated the                        procedure well. The quality of the bowel preparation                        was fair. Findings:      The perianal and digital rectal examinations were normal.      Non-bleeding internal hemorrhoids were found during retroflexion. The       hemorrhoids were Grade I (internal hemorrhoids that do not prolapse).      A moderate amount of semi-liquid stool was found in the entire colon.      A 3 mm polyp was found in the descending colon. The polyp was sessile.       The polyp was removed with a cold biopsy forceps. Resection and       retrieval were complete. Impression:           - Preparation of the colon was fair.                       - Non-bleeding internal hemorrhoids.                       - Stool in the entire examined colon.                       - One 3 mm polyp in the descending colon, removed with                        a cold biopsy forceps. Resected and retrieved. Recommendation:       - Discharge patient to home.                       - Resume previous diet.                       - Continue present medications.                       - Await pathology results.                       - Repeat colonoscopy in 5 years for surveillance. Procedure Code(s):    --- Professional ---  45380, Colonoscopy, flexible; with biopsy, single or                        multiple Diagnosis Code(s):    --- Professional ---                       Z80.0, Family history of malignant neoplasm of                        digestive organs                       D12.4, Benign neoplasm of descending colon CPT copyright 2016 American Medical Association. All rights reserved. The codes documented in this report are preliminary and upon coder review may  be revised to meet current compliance requirements. Lucilla Lame MD,  MD 04/19/2017 8:01:11 AM This report has been signed electronically. Number of Addenda: 0 Note Initiated On: 04/19/2017 7:22 AM Scope Withdrawal Time: 0 hours 7 minutes 0 seconds  Total Procedure Duration: 0 hours 16 minutes 27 seconds       [facility]

## 2017-04-19 NOTE — H&P (Signed)
Andrea Lame, MD [address] [place], [place] [address] Phone: [phone] Fax : [phone]  Primary Care Physician:  Andrea Banana., MD Primary Gastroenterologist:  Dr. Allen Beard  Pre-Procedure History & Physical: HPI:  Andrea Beard is a 62 y.o. female is here for a screening colonoscopy.   Past Medical History:  Diagnosis Date  . Anxiety   . Arthritis   . Arthropathy   . Cancer ([place])    skin  . Depression   . H/O bilateral hip replacements   . Hypopotassemia   . Morbid obesity ([place])     Past Surgical History:  Procedure Laterality Date  . ABDOMINAL HYSTERECTOMY     Dr. Quenten Beard  . BREAST BIOPSY Right    negative  . Left Hip raplacement  Left 2008 and right 2010    Prior to Admission medications   Medication Sig Start Date End Date Taking? Authorizing Provider  ALPRAZolam Duanne Moron) 0.5 MG tablet 1 to 2 tablets daily as needed 04/12/17  Yes Andrea Banana., MD  aspirin EC 81 MG tablet Take 1 tablet (81 mg total) by mouth daily. 07/05/16  Yes Andrea Banana., MD  calcium carbonate (OSCAL) 1500 (600 Ca) MG TABS tablet Take by mouth 2 (two) times daily with a meal.   Yes [provider]  Na Sulfate-K Sulfate-Mg Sulf (SUPREP BOWEL PREP KIT) 17.5-3.13-1.6 GM/177ML SOLN Take 1 kit by mouth as directed. 03/10/17   Andrea Lame, MD    Allergies as of 03/03/2017 - Review Complete 02/17/2017  Allergen Reaction Noted  . Oxycodone Rash 11/22/2014    Family History  Problem Relation Age of Onset  . Hypertension Mother   . Cancer Mother        Breast  . Breast cancer Mother 57  . Heart disease Father   . Hypertension Father   . Arthritis Sister   . Thyroid disease Brother   . Bladder Cancer Brother   . Arthritis Sister        arthritis with bilateral hip replacent and multiple myeloma disease  . Esophageal cancer Brother   . Stomach cancer Brother   . Melanoma Brother   . Bladder Cancer Brother   . Diabetes Paternal  Grandfather   . Ovarian cancer Neg Hx     Social History   Socioeconomic History  . Marital status: Married    Spouse name: Not on file  . Number of children: Not on file  . Years of education: Not on file  . Highest education level: Not on file  Social Needs  . Financial resource strain: Not on file  . Food insecurity - worry: Not on file  . Food insecurity - inability: Not on file  . Transportation needs - medical: Not on file  . Transportation needs - non-medical: Not on file  Occupational History  . Not on file  Tobacco Use  . Smoking status: Never Smoker  . Smokeless tobacco: Never Used  Substance and Sexual Activity  . Alcohol use: No    Comment: Non drinker/ No alcohol use.  . Drug use: No  . Sexual activity: Yes    Birth control/protection: Surgical  Other Topics Concern  . Not on file  Social History Narrative  . Not on file    Review of Systems: See HPI, otherwise negative ROS  Physical Exam: BP (!) 164/102   Pulse (!) 118   Temp (!) 96.7 F (35.9 C) (Tympanic)   Resp 18  Ht _0  (1.727 m)   Wt 280 lb (127 kg)   SpO2 100%   BMI 42.57 kg/m  General:   Alert,  pleasant and cooperative in NAD Head:  Normocephalic and atraumatic. Neck:  Supple; no masses or thyromegaly. Lungs:  Clear throughout to auscultation.    Heart:  Regular rate and rhythm. Abdomen:  Soft, nontender and nondistended. Normal bowel sounds, without guarding, and without rebound.   Neurologic:  Alert and  oriented x4;  grossly normal neurologically.  Impression/Plan: Andrea Beard is now here to undergo a screening colonoscopy.  Risks, benefits, and alternatives regarding colonoscopy have been reviewed with the patient.  Questions have been answered.  All parties agreeable.

## 2017-04-20 ENCOUNTER — Encounter: Payer: Self-pay | Admitting: Gastroenterology

## 2017-04-21 LAB — SURGICAL PATHOLOGY

## 2017-04-23 ENCOUNTER — Encounter: Payer: Self-pay | Admitting: Gastroenterology

## 2017-04-25 ENCOUNTER — Encounter: Payer: Self-pay | Admitting: Obstetrics and Gynecology

## 2017-05-11 ENCOUNTER — Telehealth: Payer: Self-pay | Admitting: Gastroenterology

## 2017-05-11 ENCOUNTER — Encounter: Payer: Self-pay | Admitting: Obstetrics and Gynecology

## 2017-05-11 NOTE — Telephone Encounter (Signed)
Patient calling for biopsy results

## 2017-05-12 NOTE — Telephone Encounter (Signed)
Contacted pt and advised her of colonoscopy results.

## 2017-09-09 ENCOUNTER — Encounter: Payer: Self-pay | Admitting: Obstetrics and Gynecology

## 2017-09-09 ENCOUNTER — Other Ambulatory Visit: Payer: Self-pay | Admitting: Obstetrics and Gynecology

## 2017-09-09 DIAGNOSIS — N6001 Solitary cyst of right breast: Secondary | ICD-10-CM

## 2017-09-18 ENCOUNTER — Encounter: Payer: Self-pay | Admitting: Family Medicine

## 2017-09-19 NOTE — Telephone Encounter (Signed)
Please review. Thanks!  

## 2017-10-06 ENCOUNTER — Encounter: Payer: Self-pay | Admitting: Obstetrics and Gynecology

## 2017-10-10 ENCOUNTER — Ambulatory Visit: Payer: Self-pay | Admitting: Family Medicine

## 2017-10-13 ENCOUNTER — Ambulatory Visit
Admission: RE | Admit: 2017-10-13 | Discharge: 2017-10-13 | Disposition: A | Discharge status: Home / Self Care | Payer: BLUE CROSS/BLUE SHIELD | Source: Ambulatory Visit | Attending: Obstetrics and Gynecology | Admitting: Obstetrics and Gynecology

## 2017-10-13 ENCOUNTER — Other Ambulatory Visit: Payer: Self-pay | Admitting: Obstetrics and Gynecology

## 2017-10-13 DIAGNOSIS — R928 Other abnormal and inconclusive findings on diagnostic imaging of breast: Secondary | ICD-10-CM

## 2017-10-13 DIAGNOSIS — N6310 Unspecified lump in the right breast, unspecified quadrant: Secondary | ICD-10-CM

## 2017-10-13 DIAGNOSIS — N6001 Solitary cyst of right breast: Secondary | ICD-10-CM | POA: Diagnosis present

## 2017-10-20 ENCOUNTER — Encounter: Payer: Self-pay | Admitting: Obstetrics and Gynecology

## 2017-10-24 ENCOUNTER — Other Ambulatory Visit: Payer: Self-pay | Admitting: Obstetrics and Gynecology

## 2017-10-24 ENCOUNTER — Ambulatory Visit
Admission: RE | Admit: 2017-10-24 | Discharge: 2017-10-24 | Disposition: A | Discharge status: Home / Self Care | Payer: BLUE CROSS/BLUE SHIELD | Source: Ambulatory Visit | Attending: Obstetrics and Gynecology | Admitting: Obstetrics and Gynecology

## 2017-10-24 ENCOUNTER — Encounter: Payer: Self-pay | Admitting: Obstetrics and Gynecology

## 2017-10-24 ENCOUNTER — Telehealth: Payer: Self-pay | Admitting: Obstetrics and Gynecology

## 2017-10-24 DIAGNOSIS — R928 Other abnormal and inconclusive findings on diagnostic imaging of breast: Secondary | ICD-10-CM | POA: Insufficient documentation

## 2017-10-24 DIAGNOSIS — N6310 Unspecified lump in the right breast, unspecified quadrant: Secondary | ICD-10-CM

## 2017-10-24 HISTORY — PX: BREAST BIOPSY: SHX20

## 2017-10-24 NOTE — Telephone Encounter (Signed)
Pt was called back and pt stated that she was confused about her mammogram today and was wondering why they did not do a bx on her rt breast to check the contain of the mass. Pt was confused why they put a marker in the rt breast but the mass that she has is deeper into the breast than the marker they put in. Pt is confused but was advised that MD would be aware of her concerns and she would be receiving a call back as soon as he looks over her results.

## 2017-10-24 NOTE — Telephone Encounter (Signed)
He patient LVM @ 9:55 today requesting a call back from Dr. Enzo Bi about a procedure she had done earlier today.  Please advise, thanks.

## 2017-10-25 LAB — SURGICAL PATHOLOGY

## 2017-10-27 ENCOUNTER — Ambulatory Visit (INDEPENDENT_AMBULATORY_CARE_PROVIDER_SITE_OTHER): Payer: BLUE CROSS/BLUE SHIELD | Admitting: Obstetrics and Gynecology

## 2017-10-27 VITALS — BP 126/86 | HR 86 | Wt 278.0 lb

## 2017-10-27 DIAGNOSIS — N63 Unspecified lump in unspecified breast: Secondary | ICD-10-CM

## 2017-10-27 NOTE — Progress Notes (Signed)
Chief complaint: 1.  Right breast mass 2.  Status post ultrasound guided needle biopsy  Andrea Beard presents today in the company of her husband for discussion regarding serial ultrasounds of right breast for abnormal appearing lesion identified at the time of mammography.  Subsequent biopsy of the lesion was performed; clip placement following procedure verified that the actual lesion was not biopsied.  Results of the biopsy showed findings consistent with possible fibroadenoma.  Radiology recommends follow-up stereotactic needle biopsy.  There was some confusion regarding the abnormal findings on ultrasound-whether 1 or 2  different lesions were identified. There was confusion regarding the ultrasound-guided biopsy, possibly not actually sampling the mass previously identified on serial scans.  My recommendations include the following:  Contact the radiologist who performed the biopsy to get clarification on ultrasound findings, biopsy findings, and explanation for the recommendation of follow-up stereotactic needle biopsy procedure.  Consider second opinion with general surgery, Dr. Jeannette Corpus, who may be able to give her further insight regarding abnormalities noted on scans as well as follow-up recommendations.  The patient will contact me if she wishes to proceed with a second opinion.  Brayton Mars, MD

## 2017-10-27 NOTE — Telephone Encounter (Signed)
MD wanted her to come in for a visit today. Pt was called and she made an appt to see MD today.

## 2017-10-27 NOTE — Patient Instructions (Signed)
1.  Call if general surgery consultation is desired. 2.  Recommend initial contact with radiologist who performed ultrasound-guided breast biopsy to clarify findings on multiple ultrasounds and biopsy results, including the need for possible stereotactic biopsy.

## 2017-11-07 ENCOUNTER — Ambulatory Visit: Payer: BLUE CROSS/BLUE SHIELD

## 2017-11-15 ENCOUNTER — Ambulatory Visit
Admission: RE | Admit: 2017-11-15 | Discharge: 2017-11-15 | Disposition: A | Discharge status: Home / Self Care | Payer: BLUE CROSS/BLUE SHIELD | Source: Ambulatory Visit | Attending: Obstetrics and Gynecology | Admitting: Obstetrics and Gynecology

## 2017-11-15 DIAGNOSIS — R928 Other abnormal and inconclusive findings on diagnostic imaging of breast: Secondary | ICD-10-CM | POA: Insufficient documentation

## 2017-11-15 DIAGNOSIS — N6310 Unspecified lump in the right breast, unspecified quadrant: Secondary | ICD-10-CM | POA: Insufficient documentation

## 2017-11-15 HISTORY — PX: BREAST BIOPSY: SHX20

## 2017-11-16 LAB — SURGICAL PATHOLOGY

## 2017-11-22 ENCOUNTER — Ambulatory Visit: Payer: BLUE CROSS/BLUE SHIELD

## 2017-11-23 ENCOUNTER — Telehealth: Payer: BLUE CROSS/BLUE SHIELD | Admitting: Nurse Practitioner

## 2017-11-23 DIAGNOSIS — R05 Cough: Secondary | ICD-10-CM

## 2017-11-23 DIAGNOSIS — R059 Cough, unspecified: Secondary | ICD-10-CM

## 2017-11-23 MED ORDER — AZITHROMYCIN 250 MG PO TABS
ORAL_TABLET | ORAL | 0 refills | Status: DC
Start: 2017-11-23 — End: 2018-02-22

## 2017-11-23 NOTE — Progress Notes (Signed)

## 2017-12-08 ENCOUNTER — Telehealth: Payer: Self-pay

## 2017-12-08 NOTE — Telephone Encounter (Signed)
She is already a patient here and can be seen.  If she needs to be seen please just make her an appointment

## 2017-12-08 NOTE — Telephone Encounter (Signed)
[name] is the daughter Andrea Beard. She is calling and wants to know if Dr.Gilbert can take her mother as a new patient.  Sabra Heck, Rilla DOB:08/14/1927

## 2017-12-10 ENCOUNTER — Ambulatory Visit (INDEPENDENT_AMBULATORY_CARE_PROVIDER_SITE_OTHER): Payer: BLUE CROSS/BLUE SHIELD

## 2017-12-10 DIAGNOSIS — Z23 Encounter for immunization: Secondary | ICD-10-CM

## 2017-12-13 NOTE — Telephone Encounter (Signed)
Scheduled appt for 01/05/18 and made it for 40 minutes since Ms [place]ard [place] and needs to discuss getting referrals to specialist. Thanks TNP

## 2018-02-21 NOTE — Progress Notes (Signed)
Patient ID: Andrea Beard, female   DOB: [birth year], 62 y.o.   MRN: [id number] ANNUAL PREVENTATIVE CARE GYN  ENCOUNTER NOTE  Subjective:       Andrea Beard is a 62 y.o. G3P2 female here for a routine annual gynecologic exam.  Current complaints:  1.  Recurrent cystocele, second-degree, status post prolapse surgery 2.  Decreased libido  Juliann Pulse reports no major interval health issues other than the breast disease management that was performed this past year.  She underwent several biopsies with benign findings from the abnormal ultrasound and mammograms taken this past year.  She is comfortable with the follow-up and explanation of findings.  Bowel function is normal. Bladder function is notable for occasional leakage.  No significant interval changes have occurred since last year.  Ongoing concern is decreased libido which does put some stress on her relationship with her spouse.  They do communicate well but are open to help with issues of sexuality.  A trial of testosterone cypionate intramuscular injection did have a positive effect, although it was too strong a response.  Topical testosterone cream was not used long-term because of side effects.  [name] is interested in the new medication that is for decreased libido in women and she is given a prescription for [facility] today.  He has lost significant weight this year-15 pounds.    Gynecologic History No LMP recorded. Patient has had a hysterectomy. Contraception: status post hysterectomyLAVH with anterior, posterior colporrhaphy Last Pap: n/a. Results were: normal Last mammogram: 10/2017 birad 1. Results were:  Recurrent grade 2 cystocele, minimally symptomatic  Obstetric History OB History  Gravida Para Term Preterm AB Living  _0 SAB TAB Ectopic Multiple Live Births    1     2    # Outcome Date GA Lbr Len/2nd Weight Sex Delivery Anes PTL Lv  3 Term 59    M Vag-Spont   LIV  2 Term 80    M Vag-Spont   LIV   1 TAB             Past Medical History:  Diagnosis Date  . Anxiety   . Arthritis   . Arthropathy   . Cancer ([place])    skin  . Depression   . H/O bilateral hip replacements   . Hypopotassemia   . Morbid obesity ([place])     Past Surgical History:  Procedure Laterality Date  . ABDOMINAL HYSTERECTOMY     Dr. Quenten Raven  . BREAST BIOPSY Right    negative  . BREAST BIOPSY Right 10/24/2017   fibroadenoma, venus marker  . BREAST BIOPSY Right 11/15/2017   x shape, path pending  . COLONOSCOPY WITH PROPOFOL N/A 04/19/2017   Procedure: COLONOSCOPY WITH PROPOFOL;  Surgeon: Lucilla Lame, MD;  [facility] ENDOSCOPY;  Service: Endoscopy;  Laterality: N/A;  . Left Hip raplacement  Left 2008 and right 2010    Current Outpatient Medications on File Prior to Visit  Medication Sig Dispense Refill  . ALPRAZolam (XANAX) 0.5 MG tablet 1 to 2 tablets daily as needed 60 tablet 3  . aspirin EC 81 MG tablet Take 1 tablet (81 mg total) by mouth daily. (Patient not taking: Reported on 10/27/2017) 30 tablet 12  . azithromycin (ZITHROMAX Z-PAK) 250 MG tablet As directed 6 tablet 0  . calcium carbonate (OSCAL) 1500 (600 Ca) MG TABS tablet Take by mouth 2 (two) times daily with a meal.    . Na Sulfate-K Sulfate-Mg  Sulf (SUPREP BOWEL PREP KIT) 17.5-3.13-1.6 GM/177ML SOLN Take 1 kit by mouth as directed. (Patient not taking: Reported on 10/27/2017) 1 Bottle 0   No current facility-administered medications on file prior to visit.     Allergies  Allergen Reactions  . Oxycodone Rash    Social History   Socioeconomic History  . Marital status: Married    Spouse name: Not on file  . Number of children: Not on file  . Years of education: Not on file  . Highest education level: Not on file  Occupational History  . Not on file  Social Needs  . Financial resource strain: Not on file  . Food insecurity:    Worry: Not on file    Inability: Not on file  . Transportation needs:    Medical: Not on file     Non-medical: Not on file  Tobacco Use  . Smoking status: Never Smoker  . Smokeless tobacco: Never Used  Substance and Sexual Activity  . Alcohol use: No    Comment: Non drinker/ No alcohol use.  . Drug use: No  . Sexual activity: Yes    Birth control/protection: Surgical  Lifestyle  . Physical activity:    Days per week: 2 days    Minutes per session: 30 min  . Stress: Not on file  Relationships  . Social connections:    Talks on phone: Not on file    Gets together: Not on file    Attends religious service: Not on file    Active member of club or organization: Not on file    Attends meetings of clubs or organizations: Not on file    Relationship status: Not on file  . Intimate partner violence:    Fear of current or ex partner: No    Emotionally abused: No    Physically abused: No    Forced sexual activity: No  Other Topics Concern  . Not on file  Social History Narrative  . Not on file    Family History  Problem Relation Age of Onset  . Hypertension Mother   . Cancer Mother        Breast  . Breast cancer Mother 66  . Heart disease Father   . Hypertension Father   . Arthritis Sister   . Thyroid disease Brother   . Bladder Cancer Brother   . Arthritis Sister        arthritis with bilateral hip replacent and multiple myeloma disease  . Esophageal cancer Brother   . Stomach cancer Brother   . Melanoma Brother   . Bladder Cancer Brother   . Diabetes Paternal Grandfather   . Ovarian cancer Neg Hx     The following portions of the patient's history were reviewed and updated as appropriate: allergies, current medications, past family history, past medical history, past social history, past surgical history and problem list.  Review of Systems Review of Systems  Constitutional: Positive for weight loss.       Few vasomotor symptoms  Respiratory: Negative.   Cardiovascular: Negative.   Gastrointestinal: Negative.   Genitourinary:       Decreased libido   Musculoskeletal: Negative.   Skin: Negative.   Neurological: Negative.   Psychiatric/Behavioral: Negative.   All other systems reviewed and are negative.     Objective:   BP 134/88   Ht _0  (1.727 m)   Wt 272 lb 3.2 oz (123.5 kg)   BMI 41.39 kg/m  CONSTITUTIONAL: Well-developed, well-nourished female in  no acute distress.  PSYCHIATRIC: Normal mood and affect. Normal behavior. Normal judgment and thought content. [place]: Alert and oriented to person, place, and time. Normal muscle tone coordination. No cranial nerve deficit noted. HENT:  Normocephalic, atraumatic, External right and left ear normal. EYES: Conjunctivae and EOM are normal. No scleral icterus.  NECK: Normal range of motion, supple, no masses.  Normal thyroid.  SKIN: Skin is warm and dry. No rash noted. Not diaphoretic. No erythema. No pallor. CARDIOVASCULAR: Normal heart rate noted, regular rhythm, no murmur. RESPIRATORY: Clear to auscultation bilaterally. Effort and breath sounds normal, no problems with respiration noted. BREASTS: Symmetric in size. No masses, skin changes, nipple drainage, or lymphadenopathy. ABDOMEN: Soft, normal bowel sounds, no distention noted.  No tenderness, rebound or guarding.  BLADDER: Normal PELVIC:  External Genitalia: Normal  BUS: Normal  Vagina: Second-degree cystocele; good apex support; no rectocele  Cervix: Surgically absent  Uterus: Surgically absent  Adnexa: Normal  RV: External Exam NormaI, No Rectal Masses and Normal Sphincter tone  MUSCULOSKELETAL: Normal range of motion. No tenderness.  No cyanosis, clubbing, or edema.  2+ distal pulses. LYMPHATIC: No Axillary, Supraclavicular, or Inguinal Adenopathy.    Assessment:   Annual gynecologic examination 63 y.o. Contraception: status post hysterectomyLAVH with anterior, posterior colporrhaphy Obesity 1 Decreased libido, off testosterone therapy at this time.  Willing to do a trial of Vyleesi Recurrent second-degree  cystocele, minimally symptomatic with urinary frequency hourly during the day and nocturia times 2 at night Menopausal state, asymptomatic   Plan:  Pap:Not needed Mammogram: Ordered Stool Guaiac Testing: colonoscopy done 04/2017 Due every 5 years Labs: thru pcp Routine preventative health maintenance measures emphasized: Exercise/Diet/Weight control, Tobacco Warnings and Alcohol/Substance use risks Calcium with vitamin D supplementation daily is recommended Exercise and weight loss goal of 1 pound per month is encouraged Pessary use for cystocele is discussed but declined at this time Trial of Vyleesi; she will give [place] feedback as to its effectiveness. Return to [place], [place] acting as scribe for Dr. Enzo Bi. I have reviewed, updated, and concur with the information scribed. Brayton Mars, MD  Note: This dictation was prepared with Dragon dictation along with smaller phrase technology. Any transcriptional errors that result from this process are unintentional.

## 2018-02-22 ENCOUNTER — Ambulatory Visit (INDEPENDENT_AMBULATORY_CARE_PROVIDER_SITE_OTHER): Payer: BLUE CROSS/BLUE SHIELD | Admitting: Obstetrics and Gynecology

## 2018-02-22 ENCOUNTER — Encounter: Payer: Self-pay | Admitting: Obstetrics and Gynecology

## 2018-02-22 VITALS — BP 134/88 | Ht 68.0 in | Wt 272.2 lb

## 2018-02-22 DIAGNOSIS — Z01419 Encounter for gynecological examination (general) (routine) without abnormal findings: Secondary | ICD-10-CM

## 2018-02-22 DIAGNOSIS — Z90710 Acquired absence of both cervix and uterus: Secondary | ICD-10-CM

## 2018-02-22 DIAGNOSIS — N8111 Cystocele, midline: Secondary | ICD-10-CM

## 2018-02-22 DIAGNOSIS — R6882 Decreased libido: Secondary | ICD-10-CM

## 2018-02-22 DIAGNOSIS — Z780 Asymptomatic menopausal state: Secondary | ICD-10-CM

## 2018-02-22 DIAGNOSIS — R7989 Other specified abnormal findings of blood chemistry: Secondary | ICD-10-CM

## 2018-02-22 NOTE — Patient Instructions (Signed)
1.  No Pap smear is done.  No further Pap smears are needed. 2.  Mammogram is ordered for August 2020 3.  Colonoscopy was done January 2019.  Stool guaiac testing is deferred. 4.  Screening labs are obtained through primary care 5.  Continue with healthy eating and exercise with controlled weight loss. 6.  Continue with calcium and vitamin D supplementation twice a day 7.  Consider return for pessary trial if pelvic organ prolapse worsens (cystocele-incomplete bladder emptying, increased pelvic pressure, or leaking would be symptoms to be addressed) 8.  Trial of Vileesi for decreased libodo 9.  Return in 1 year for annual exam-Dr. Bradford Maintenance for Postmenopausal Women Menopause is a normal process in which your reproductive ability comes to an end. This process happens gradually over a span of months to years, usually between the ages of 63 and 79. Menopause is complete when you have missed 12 consecutive menstrual periods. It is important to talk with your health care provider about some of the most common conditions that affect postmenopausal women, such as heart disease, cancer, and bone loss (osteoporosis). Adopting a healthy lifestyle and getting preventive care can help to promote your health and wellness. Those actions can also lower your chances of developing some of these common conditions. What should I know about menopause? During menopause, you may experience a number of symptoms, such as:  Moderate-to-severe hot flashes.  Night sweats.  Decrease in sex drive.  Mood swings.  Headaches.  Tiredness.  Irritability.  Memory problems.  Insomnia.  Choosing to treat or not to treat menopausal changes is an individual decision that you make with your health care provider. What should I know about hormone replacement therapy and supplements? Hormone therapy products are effective for treating symptoms that are associated with menopause, such as hot flashes and  night sweats. Hormone replacement carries certain risks, especially as you become older. If you are thinking about using estrogen or estrogen with progestin treatments, discuss the benefits and risks with your health care provider. What should I know about heart disease and stroke? Heart disease, heart attack, and stroke become more likely as you age. This may be due, in part, to the hormonal changes that your body experiences during menopause. These can affect how your body processes dietary fats, triglycerides, and cholesterol. Heart attack and stroke are both medical emergencies. There are many things that you can do to help prevent heart disease and stroke:  Have your blood pressure checked at least every 1-2 years. High blood pressure causes heart disease and increases the risk of stroke.  If you are 53-78 years old, ask your health care provider if you should take aspirin to prevent a heart attack or a stroke.  Do not use any tobacco products, including cigarettes, chewing tobacco, or electronic cigarettes. If you need help quitting, ask your health care provider.  It is important to eat a healthy diet and maintain a healthy weight. ? Be sure to include plenty of vegetables, fruits, low-fat dairy products, and lean protein. ? Avoid eating foods that are high in solid fats, added sugars, or salt (sodium).  Get regular exercise. This is one of the most important things that you can do for your health. ? Try to exercise for at least 150 minutes each week. The type of exercise that you do should increase your heart rate and make you sweat. This is known as moderate-intensity exercise. ? Try to do strengthening exercises at least twice  each week. Do these in addition to the moderate-intensity exercise.  Know your numbers.Ask your health care provider to check your cholesterol and your blood glucose. Continue to have your blood tested as directed by your health care provider.  What should I  know about cancer screening? There are several types of cancer. Take the following steps to reduce your risk and to catch any cancer development as early as possible. Breast Cancer  Practice breast self-awareness. ? This means understanding how your breasts normally appear and feel. ? It also means doing regular breast self-exams. Let your health care provider know about any changes, no matter how small.  If you are 30 or older, have a clinician do a breast exam (clinical breast exam or CBE) every year. Depending on your age, family history, and medical history, it may be recommended that you also have a yearly breast X-ray (mammogram).  If you have a family history of breast cancer, talk with your health care provider about genetic screening.  If you are at high risk for breast cancer, talk with your health care provider about having an MRI and a mammogram every year.  Breast cancer (BRCA) gene test is recommended for women who have family members with BRCA-related cancers. Results of the assessment will determine the need for genetic counseling and BRCA1 and for BRCA2 testing. BRCA-related cancers include these types: ? Breast. This occurs in males or females. ? Ovarian. ? Tubal. This may also be called fallopian tube cancer. ? Cancer of the abdominal or pelvic lining (peritoneal cancer). ? Prostate. ? Pancreatic.  Cervical, Uterine, and Ovarian Cancer Your health care provider may recommend that you be screened regularly for cancer of the pelvic organs. These include your ovaries, uterus, and vagina. This screening involves a pelvic exam, which includes checking for microscopic changes to the surface of your cervix (Pap test).  For women ages 21-65, health care providers may recommend a pelvic exam and a Pap test every three years. For women ages 75-65, they may recommend the Pap test and pelvic exam, combined with testing for human papilloma virus (HPV), every five years. Some types of  HPV increase your risk of cervical cancer. Testing for HPV may also be done on women of any age who have unclear Pap test results.  Other health care providers may not recommend any screening for nonpregnant women who are considered low risk for pelvic cancer and have no symptoms. Ask your health care provider if a screening pelvic exam is right for you.  If you have had past treatment for cervical cancer or a condition that could lead to cancer, you need Pap tests and screening for cancer for at least 20 years after your treatment. If Pap tests have been discontinued for you, your risk factors (such as having a new sexual partner) need to be reassessed to determine if you should start having screenings again. Some women have medical problems that increase the chance of getting cervical cancer. In these cases, your health care provider may recommend that you have screening and Pap tests more often.  If you have a family history of uterine cancer or ovarian cancer, talk with your health care provider about genetic screening.  If you have vaginal bleeding after reaching menopause, tell your health care provider.  There are currently no reliable tests available to screen for ovarian cancer.  Lung Cancer Lung cancer screening is recommended for adults 24-31 years old who are at high risk for lung cancer because of  a history of smoking. A yearly low-dose CT scan of the lungs is recommended if you:  Currently smoke.  Have a history of at least 30 pack-years of smoking and you currently smoke or have quit within the past 15 years. A pack-year is smoking an average of one pack of cigarettes per day for one year.  Yearly screening should:  Continue until it has been 15 years since you quit.  Stop if you develop a health problem that would prevent you from having lung cancer treatment.  Colorectal Cancer  This type of cancer can be detected and can often be prevented.  Routine colorectal cancer  screening usually begins at age 41 and continues through age 71.  If you have risk factors for colon cancer, your health care provider may recommend that you be screened at an earlier age.  If you have a family history of colorectal cancer, talk with your health care provider about genetic screening.  Your health care provider may also recommend using home test kits to check for hidden blood in your stool.  A small camera at the end of a tube can be used to examine your colon directly (sigmoidoscopy or colonoscopy). This is done to check for the earliest forms of colorectal cancer.  Direct examination of the colon should be repeated every 5-10 years until age 28. However, if early forms of precancerous polyps or small growths are found or if you have a family history or genetic risk for colorectal cancer, you may need to be screened more often.  Skin Cancer  Check your skin from head to toe regularly.  Monitor any moles. Be sure to tell your health care provider: ? About any new moles or changes in moles, especially if there is a change in a mole's shape or color. ? If you have a mole that is larger than the size of a pencil eraser.  If any of your family members has a history of skin cancer, especially at a young age, talk with your health care provider about genetic screening.  Always use sunscreen. Apply sunscreen liberally and repeatedly throughout the day.  Whenever you are outside, protect yourself by wearing long sleeves, pants, a wide-brimmed hat, and sunglasses.  What should I know about osteoporosis? Osteoporosis is a condition in which bone destruction happens more quickly than new bone creation. After menopause, you may be at an increased risk for osteoporosis. To help prevent osteoporosis or the bone fractures that can happen because of osteoporosis, the following is recommended:  If you are 33-38 years old, get at least 1,000 mg of calcium and at least 600 mg of vitamin D  per day.  If you are older than age 60 but younger than age 45, get at least 1,200 mg of calcium and at least 600 mg of vitamin D per day.  If you are older than age 39, get at least 1,200 mg of calcium and at least 800 mg of vitamin D per day.  Smoking and excessive alcohol intake increase the risk of osteoporosis. Eat foods that are rich in calcium and vitamin D, and do weight-bearing exercises several times each week as directed by your health care provider. What should I know about how menopause affects my mental health? Depression may occur at any age, but it is more common as you become older. Common symptoms of depression include:  Low or sad mood.  Changes in sleep patterns.  Changes in appetite or eating patterns.  Feeling an  overall lack of motivation or enjoyment of activities that you previously enjoyed.  Frequent crying spells.  Talk with your health care provider if you think that you are experiencing depression. What should I know about immunizations? It is important that you get and maintain your immunizations. These include:  Tetanus, diphtheria, and pertussis (Tdap) booster vaccine.  Influenza every year before the flu season begins.  Pneumonia vaccine.  Shingles vaccine.  Your health care provider may also recommend other immunizations. This information is not intended to replace advice given to you by your health care provider. Make sure you discuss any questions you have with your health care provider. Document Released: 05/14/2005 Document Revised: 10/10/2015 Document Reviewed: 12/24/2014 Elsevier Interactive Patient Education  2018 [employer].

## 2018-04-03 ENCOUNTER — Telehealth: Payer: BLUE CROSS/BLUE SHIELD | Admitting: Family

## 2018-04-03 DIAGNOSIS — J019 Acute sinusitis, unspecified: Secondary | ICD-10-CM

## 2018-04-03 DIAGNOSIS — B9689 Other specified bacterial agents as the cause of diseases classified elsewhere: Secondary | ICD-10-CM

## 2018-04-03 DIAGNOSIS — J329 Chronic sinusitis, unspecified: Secondary | ICD-10-CM

## 2018-04-03 MED ORDER — AMOXICILLIN-POT CLAVULANATE 875-125 MG PO TABS: 1.0000 | ORAL_TABLET | Freq: Two times a day (BID) | ORAL | 0 refills | Status: DC

## 2018-04-03 NOTE — Progress Notes (Signed)

## 2018-04-03 NOTE — Progress Notes (Signed)

## 2018-04-13 ENCOUNTER — Encounter: Payer: BLUE CROSS/BLUE SHIELD | Admitting: Family Medicine

## 2018-04-16 ENCOUNTER — Other Ambulatory Visit: Payer: Self-pay

## 2018-04-16 DIAGNOSIS — Z1231 Encounter for screening mammogram for malignant neoplasm of breast: Secondary | ICD-10-CM

## 2018-04-29 IMAGING — MG DIGITAL SCREENING BILATERAL MAMMOGRAM WITH CAD
4 series · 4 of 4 positions shown · non-contrast
Comparison: Previous exam(s).

ADDENDUM:
On the initial dictation there was an error in the auto-population
of the correct template, and the report stated that the patient had
implants, which she does not. I have re-reviewed the images, and the
findings, impression, recommendation and BI-RADS category are
otherwise unchanged.
CLINICAL DATA: Screening.

EXAM:
DIGITAL SCREENING BILATERAL MAMMOGRAM WITH CAD
The patient has retropectoral implants. Standard and implant
displaced views were performed.

[R MLO]
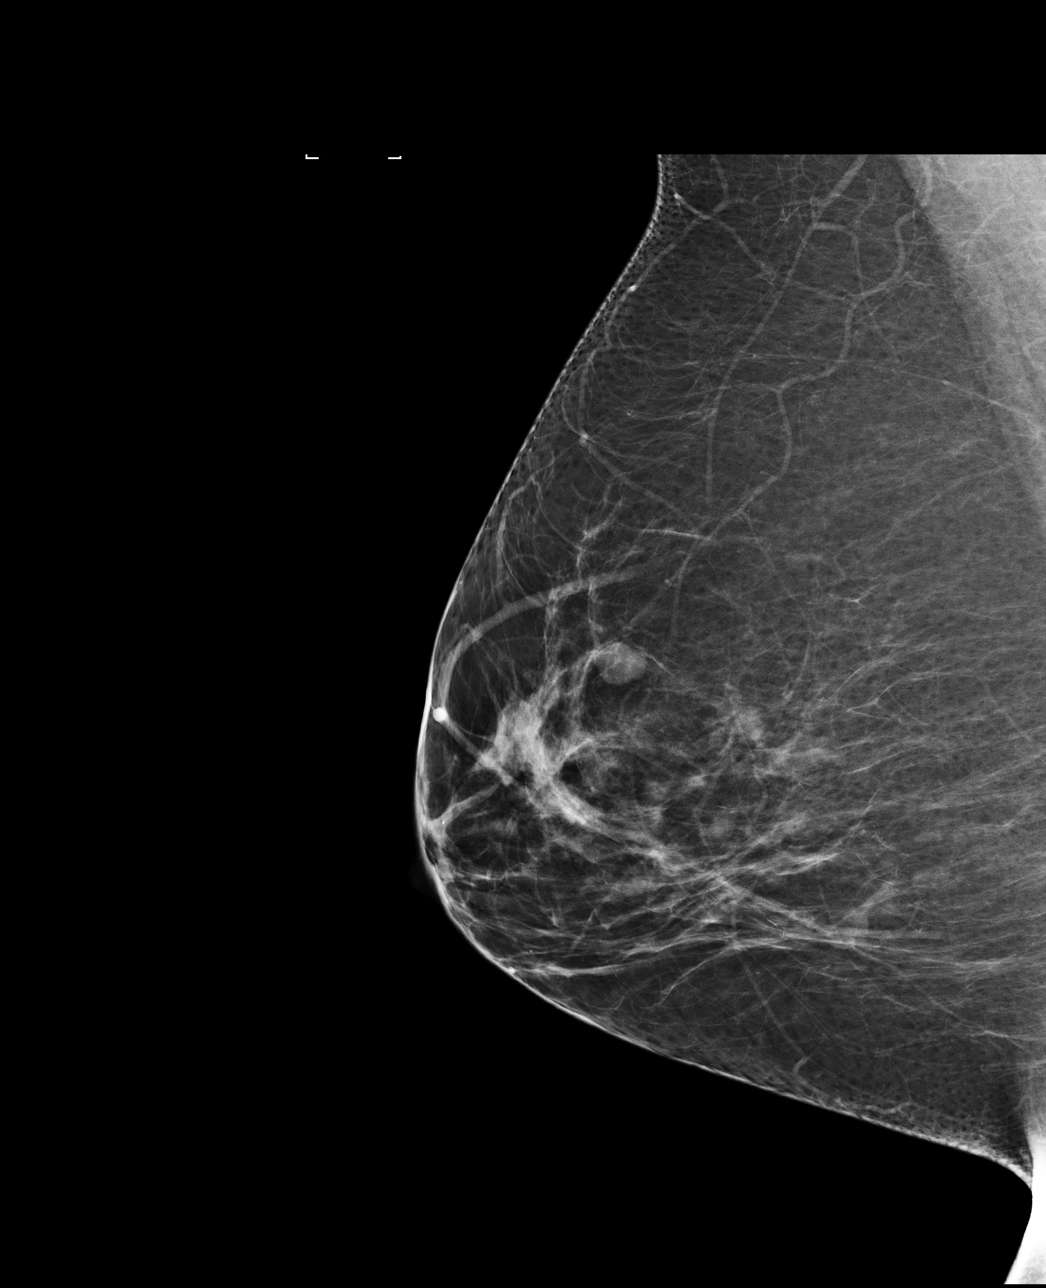

[R CC]
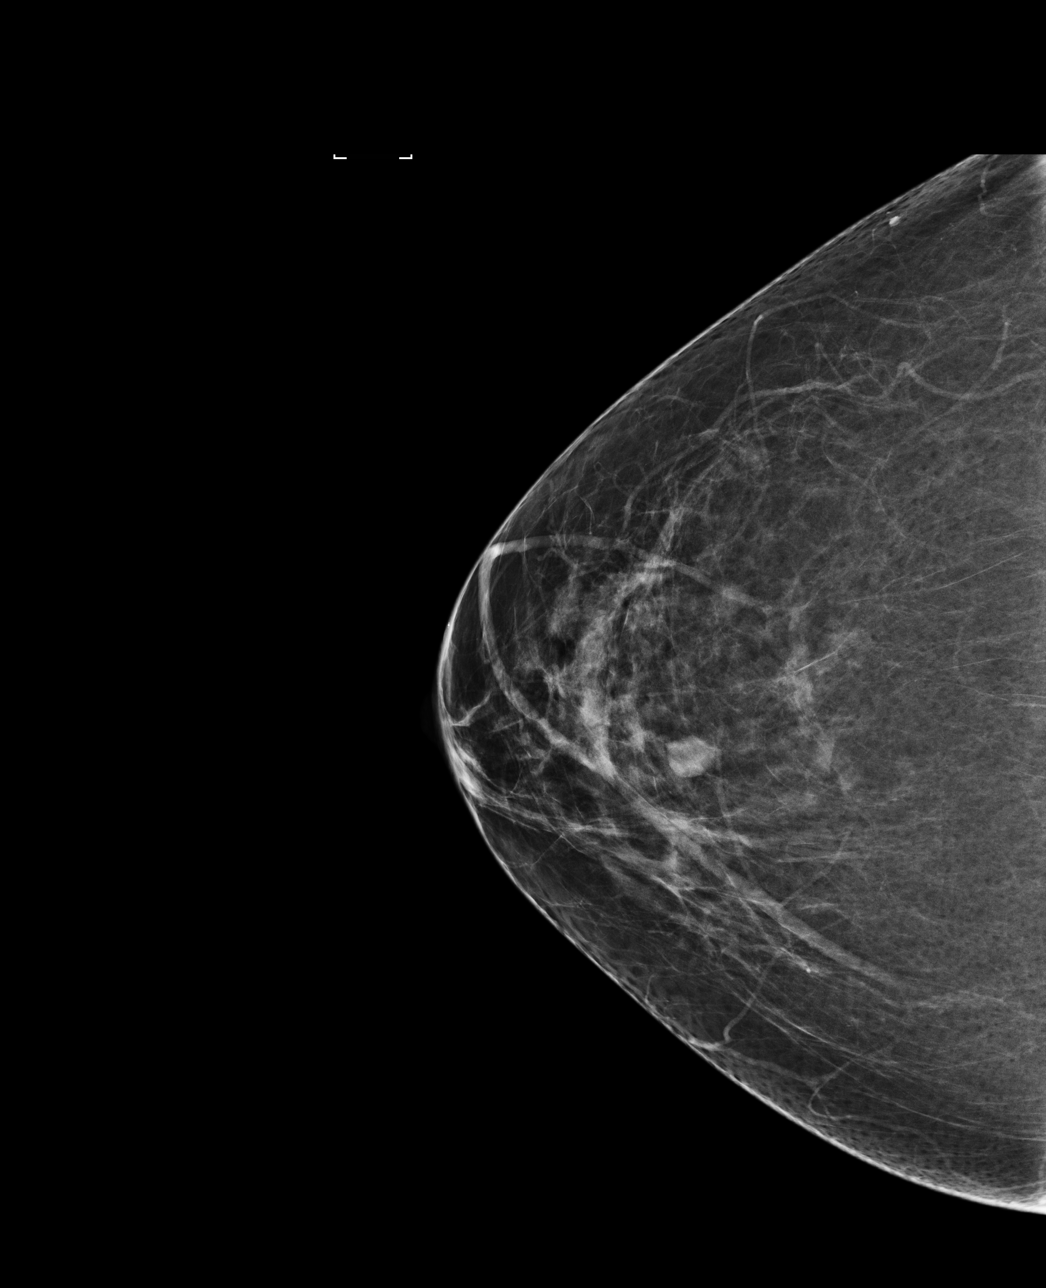

[L MLO]
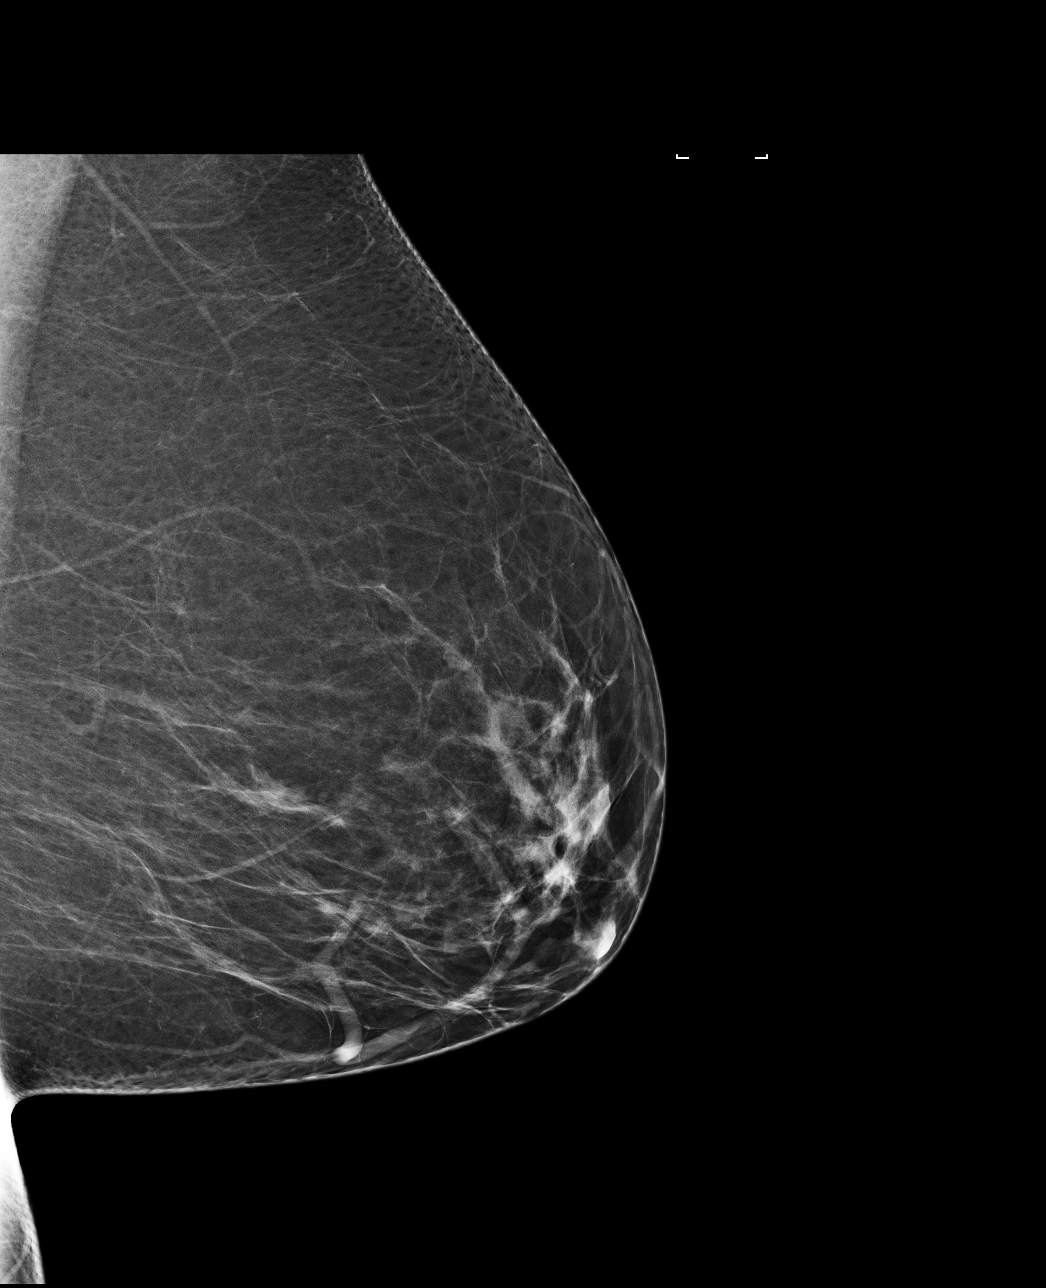

[L CC]
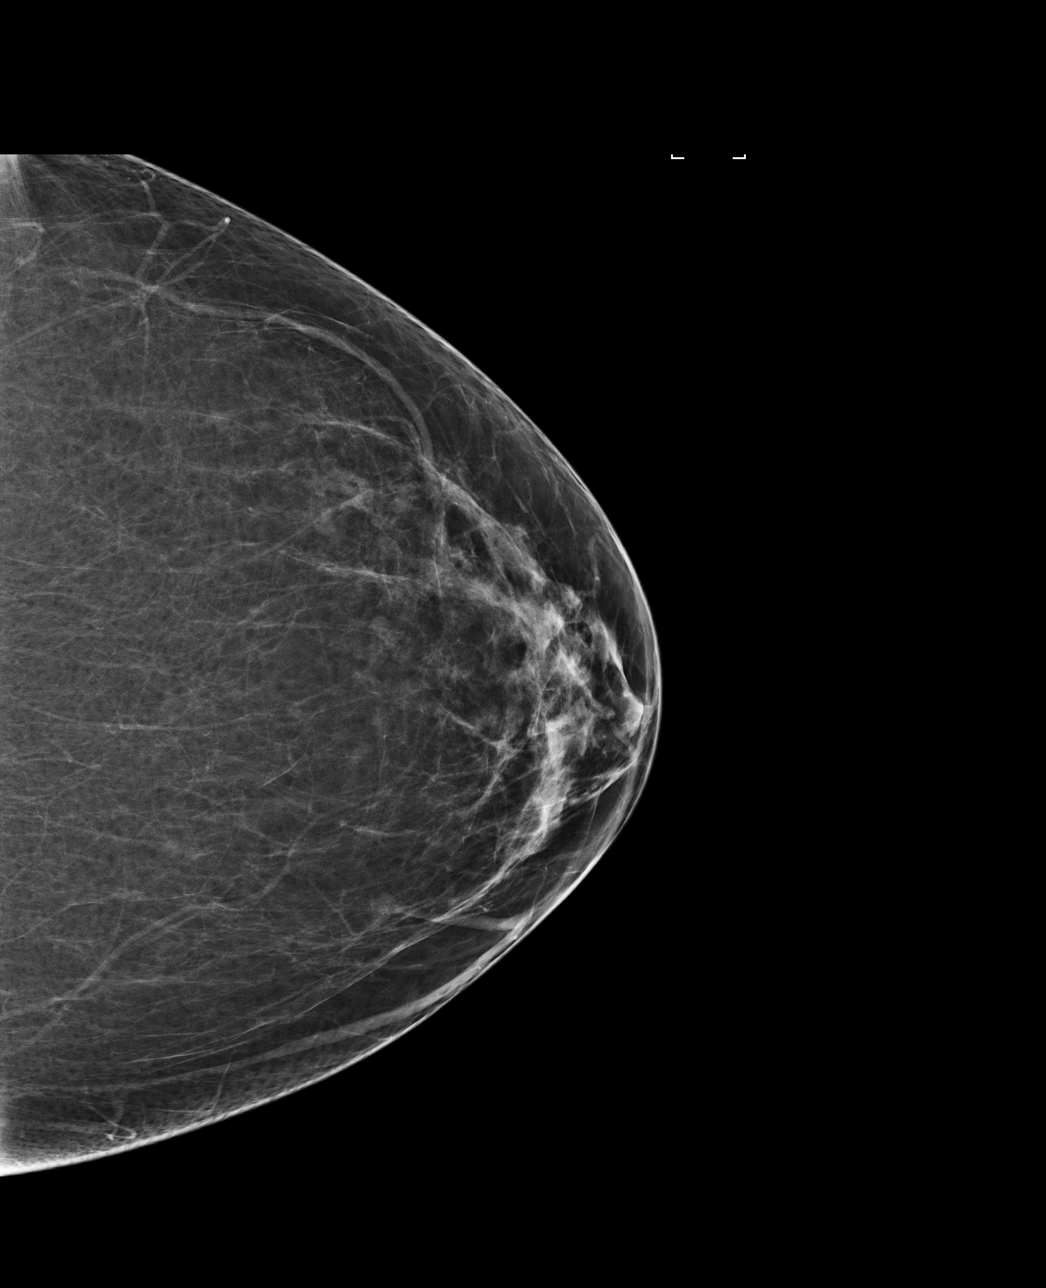

[4 of 4 positions shown; findings below may reference images not displayed]

ACR Breast Density Category b: There are scattered areas of
fibroglandular density.
FINDINGS: There are no findings suspicious for malignancy. Images were
processed with CAD.
IMPRESSION: No mammographic evidence of malignancy. A result letter of this
screening mammogram will be mailed directly to the patient.

RECOMMENDATION:
Screening mammogram in one year. (Code:[id number])

BI-RADS CATEGORY  1:  Negative.

## 2018-05-05 ENCOUNTER — Ambulatory Visit
Admission: RE | Admit: 2018-05-05 | Discharge: 2018-05-05 | Disposition: A | Discharge status: Home / Self Care | Payer: BLUE CROSS/BLUE SHIELD | Source: Ambulatory Visit | Attending: Obstetrics and Gynecology | Admitting: Obstetrics and Gynecology

## 2018-05-05 DIAGNOSIS — Z1231 Encounter for screening mammogram for malignant neoplasm of breast: Secondary | ICD-10-CM | POA: Diagnosis present

## 2018-05-11 ENCOUNTER — Other Ambulatory Visit: Payer: Self-pay

## 2018-05-11 MED ORDER — OSELTAMIVIR PHOSPHATE 75 MG PO CAPS: 75.0000 mg | ORAL_CAPSULE | Freq: Two times a day (BID) | ORAL | 0 refills | Status: DC

## 2018-05-11 NOTE — Progress Notes (Signed)
Ok to order per Dr. Gilbert.  

## 2018-06-14 ENCOUNTER — Other Ambulatory Visit: Payer: Self-pay

## 2018-06-14 ENCOUNTER — Ambulatory Visit (INDEPENDENT_AMBULATORY_CARE_PROVIDER_SITE_OTHER): Payer: BLUE CROSS/BLUE SHIELD | Admitting: Family Medicine

## 2018-06-14 VITALS — BP 126/78 | HR 62 | Temp 98.3°F | Resp 16 | Ht 68.0 in | Wt 276.0 lb

## 2018-06-14 DIAGNOSIS — F419 Anxiety disorder, unspecified: Secondary | ICD-10-CM

## 2018-06-14 DIAGNOSIS — Z85828 Personal history of other malignant neoplasm of skin: Secondary | ICD-10-CM

## 2018-06-14 DIAGNOSIS — E668 Other obesity: Secondary | ICD-10-CM

## 2018-06-14 DIAGNOSIS — F3289 Other specified depressive episodes: Secondary | ICD-10-CM | POA: Diagnosis not present

## 2018-06-14 DIAGNOSIS — E669 Obesity, unspecified: Secondary | ICD-10-CM

## 2018-06-14 DIAGNOSIS — I38 Endocarditis, valve unspecified: Secondary | ICD-10-CM

## 2018-06-14 DIAGNOSIS — Z0000 Encounter for general adult medical examination without abnormal findings: Secondary | ICD-10-CM

## 2018-06-14 DIAGNOSIS — R7303 Prediabetes: Secondary | ICD-10-CM

## 2018-06-14 DIAGNOSIS — Z23 Encounter for immunization: Secondary | ICD-10-CM

## 2018-06-14 DIAGNOSIS — Z636 Dependent relative needing care at home: Secondary | ICD-10-CM

## 2018-06-14 DIAGNOSIS — Z6841 Body Mass Index (BMI) 40.0 and over, adult: Secondary | ICD-10-CM

## 2018-06-14 MED ORDER — ALPRAZOLAM 0.5 MG PO TABS: ORAL_TABLET | ORAL | 3 refills | Status: AC

## 2018-06-14 NOTE — Progress Notes (Signed)
Patient: Andrea Beard, Female    DOB: 1955/05/16, 63 y.o.   MRN: [id number] Visit Date: 06/14/2018  Today's Provider: Wilhemena Durie, MD   Chief Complaint  Patient presents with  . Annual Exam   Subjective:  Andrea Beard is a 63 y.o. female who presents today for health maintenance and complete physical. She is feeling fairly well. She reports exercising none. She reports she is sleeping fairly well.  Immunization History  Administered Date(s) Administered  . Influenza,inj,Quad PF,6+ Mos 01/10/2015, 01/10/2016, 03/07/2017, 12/10/2017  . Influenza-Unspecified 01/11/2013, 01/12/2014  . Rabies, IM 01/21/2017, 01/24/2017, 01/28/2017, 02/04/2017  . Tdap 06/29/2007   04/19/17 Colonoscopy, Dr Amado Coe polyp, negative for dysplasia or malignancy. Repeat 5 years. 05/05/2018 Mammogram-Gyn, Dr Enzo Bi.  U/S ordered. BMD-none reported Pap smear-s/p hysterectomy   Review of Systems  Constitutional: Negative.        Irritability  HENT: Positive for sinus pressure.   Eyes: Negative.   Respiratory: Negative.   Cardiovascular: Negative.   Gastrointestinal: Negative.   Endocrine: Negative.   Genitourinary: Negative.   Musculoskeletal: Positive for back pain, neck pain and neck stiffness.  Allergic/Immunologic: Negative.   Neurological: Positive for headaches.  Hematological: Negative.   Psychiatric/Behavioral: The patient is nervous/anxious.     Social History   Socioeconomic History  . Marital status: Married    Spouse name: Not on file  . Number of children: Not on file  . Years of education: Not on file  . Highest education level: Not on file  Occupational History  . Not on file  Social Needs  . Financial resource strain: Not on file  . Food insecurity:    Worry: Not on file    Inability: Not on file  . Transportation needs:    Medical: Not on file    Non-medical: Not on file  Tobacco Use  . Smoking status: Never Smoker  . Smokeless tobacco:  Never Used  Substance and Sexual Activity  . Alcohol use: No    Comment: Non drinker/ No alcohol use.  . Drug use: No  . Sexual activity: Yes    Birth control/protection: Surgical  Lifestyle  . Physical activity:    Days per week: 2 days    Minutes per session: 30 min  . Stress: Not on file  Relationships  . Social connections:    Talks on phone: Not on file    Gets together: Not on file    Attends religious service: Not on file    Active member of club or organization: Not on file    Attends meetings of clubs or organizations: Not on file    Relationship status: Not on file  . Intimate partner violence:    Fear of current or ex partner: No    Emotionally abused: No    Physically abused: No    Forced sexual activity: No  Other Topics Concern  . Not on file  Social History Narrative  . Not on file    Patient Active Problem List   Diagnosis Date Noted  . Family history of malignant neoplasm of gastrointestinal tract   . Benign neoplasm of descending colon   . Chest pain 06/24/2015  . Combined fat and carbohydrate induced hyperlipemia 06/24/2015  . MI (mitral incompetence) 06/24/2015  . 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 Cystocele, midline 02/12/2015  . Status post laparoscopic assisted vaginal hysterectomy (LAVH) 02/12/2015  . Adenomyosis 02/12/2015  . Abdominal pain, left upper quadrant 11/22/2014  . Abnormal blood level of cobalt 11/22/2014  .  Anxiety 11/22/2014  . Arthropathia 11/22/2014  . Decreased libido 11/22/2014  . Clinical depression 11/22/2014  . Fibroid 11/22/2014  . Borderline diabetes 11/22/2014  . History of repair of hip joint 11/22/2014  . Cardiac murmur 11/22/2014  . Titanium poisoning 11/22/2014  . Arthralgia of hip 11/22/2014  . Decreased potassium in the blood 11/22/2014  . Asymptomatic postmenopausal status 11/22/2014  . Extreme obesity 11/22/2014  . Cramp of limb 11/22/2014  . Bacterial upper respiratory infection 11/22/2014  . Screening  examination for poliomyelitis 11/22/2014  . Foot tendinitis 11/22/2014  . Head revolving around 11/22/2014  . Menopause 11/22/2014  . Idiopathic localized osteoarthropathy 10/02/2012    Past Surgical History:  Procedure Laterality Date  . ABDOMINAL HYSTERECTOMY     Dr. Quenten Raven  . BREAST BIOPSY Right    negative  . BREAST BIOPSY Right 10/24/2017   fibroadenoma, venus marker  . BREAST BIOPSY Right 11/15/2017   x shape, USUAL DUCTAL HYPERPLASIA, COLUMNAR CELL CHANGE, SCLEROSING ADENOSIS  . COLONOSCOPY WITH PROPOFOL N/A 04/19/2017   Procedure: COLONOSCOPY WITH PROPOFOL;  Surgeon: Lucilla Lame, MD;  Location[facility];  Service: Endoscopy;  Laterality: N/A;  . Left Hip raplacement  Left 2008 and right 2010    Her family history includes Arthritis in her sister and sister; Bladder Cancer in her brother and brother; Breast cancer (age of onset: 17) in her mother; Cancer in her mother; Diabetes in her paternal grandfather; Esophageal cancer in her brother; Heart disease in her father; Hypertension in her father and mother; Melanoma in her brother; Stomach cancer in her brother; Thyroid disease in her brother.     Outpatient Encounter Medications as of 06/14/2018  Medication Sig  . ALPRAZolam (XANAX) 0.5 MG tablet 1 to 2 tablets daily as needed  . aspirin EC 81 MG tablet Take 1 tablet (81 mg total) by mouth daily.  . calcium carbonate (OSCAL) 1500 (600 Ca) MG TABS tablet Take by mouth 2 (two) times daily with a meal.  . [DISCONTINUED] amoxicillin-clavulanate (AUGMENTIN) 875-125 MG tablet Take 1 tablet by mouth 2 (two) times daily.  . [DISCONTINUED] oseltamivir (TAMIFLU) 75 MG capsule Take 1 capsule (75 mg total) by mouth 2 (two) times daily.   No facility-administered encounter medications on file as of 06/14/2018.    Fall Risk  06/14/2018 04/12/2017 02/25/2015  Falls in the past year? 0 No No   Depression screen [facility] 2/9 06/14/2018 04/12/2017 04/08/2016 02/25/2015  Decreased Interest 0 0  0 0  Down, Depressed, Hopeless 1 1 0 0  PHQ - 2 Score 1 1 0 0  Altered sleeping 1 - - -  Tired, decreased energy 2 - - -  Change in appetite 2 - - -  Feeling bad or failure about yourself  2 - - -  Trouble concentrating 0 - - -  Moving slowly or fidgety/restless 1 - - -  Suicidal thoughts 0 - - -  PHQ-9 Score 9 - - -  Difficult doing work/chores Somewhat difficult - - -   Functional Status Survey: Is the patient deaf or have difficulty hearing?: No Does the patient have difficulty seeing, even when wearing glasses/contacts?: No Does the patient have difficulty concentrating, remembering, or making decisions?: No Does the patient have difficulty walking or climbing stairs?: No Does the patient have difficulty dressing or bathing?: No Does the patient have difficulty doing errands alone such as visiting a doctor's office or shopping?: No     Office Visit from 06/14/2018 in [facility]  AUDIT-C Score  0     GAD 7 : Generalized Anxiety Score 06/14/2018  Nervous, Anxious, on Edge 2  Control/stop worrying 1  Worry too much - different things 1  Trouble relaxing 1  Restless 0  Easily annoyed or irritable 2  Afraid - awful might happen 1  Total GAD 7 Score 8  Anxiety Difficulty Somewhat difficult   Results of the Epworth flowsheet 06/14/2018  Sitting and reading 1  Watching TV 1  Sitting, inactive in a public place (e.g. a theatre or a meeting) 0  As a passenger in a car for an hour without a break 0  Lying down to rest in the afternoon when circumstances permit 3  Sitting and talking to someone 0  Sitting quietly after a lunch without alcohol 1  In a car, while stopped for a few minutes in traffic 0  Total score 6   Current Exercise Habits: The patient does not participate in regular exercise at present   Body mass index is 41.97 kg/m.  Patient Care Team: Jerrol Banana., MD as PCP - General (Family Medicine)      Objective:   Vitals:  Vitals:    06/14/18 1012  BP: 126/78  Pulse: 62  Resp: 16  Temp: 98.3 F (36.8 C)  TempSrc: Oral  SpO2: 99%  Weight: 276 lb (125.2 kg)  Height: 5\' 8"  (1.727 m)    Physical Exam Constitutional:      Appearance: Normal appearance. She is obese.  HENT:     Head: Normocephalic and atraumatic.     Right Ear: Tympanic membrane, ear canal and external ear normal.     Left Ear: Tympanic membrane, ear canal and external ear normal.     Nose: Nose normal.     Mouth/Throat:     Mouth: Mucous membranes are moist.     Pharynx: Oropharynx is clear.  Eyes:     Conjunctiva/sclera: Conjunctivae normal.     Pupils: Pupils are equal, round, and reactive to light.  Neck:     Musculoskeletal: Normal range of motion and neck supple.  Cardiovascular:     Rate and Rhythm: Normal rate and regular rhythm.     Pulses: Normal pulses.     Heart sounds: Normal heart sounds.  Pulmonary:     Effort: Pulmonary effort is normal.     Breath sounds: Normal breath sounds.  Abdominal:     General: Abdomen is flat. Bowel sounds are normal.     Palpations: Abdomen is soft.  Musculoskeletal: Normal range of motion.  Skin:    General: Skin is warm and dry.  Neurological:     General: No focal deficit present.     Mental Status: She is alert and oriented to person, place, and time. Mental status is at baseline.  Psychiatric:        Mood and Affect: Mood normal.        Behavior: Behavior normal.        Thought Content: Thought content normal.        Judgment: Judgment normal.    GAD 7 : Generalized Anxiety Score 06/14/2018  Nervous, Anxious, on Edge 2  Control/stop worrying 1  Worry too much - different things 1  Trouble relaxing 1  Restless 0  Easily annoyed or irritable 2  Afraid - awful might happen 1  Total GAD 7 Score 8  Anxiety Difficulty Somewhat difficult     Depression Screen [facility] 2/9 Scores 06/14/2018 04/12/2017 04/08/2016 02/25/2015  PHQ - 2 Score 1 1 0 0  PHQ- 9 Score 9 - - -      Assessment &  Plan:     Routine Health Maintenance and Physical Exam  Exercise Activities and Dietary recommendations Goals   None     Immunization History  Administered Date(s) Administered  . Influenza,inj,Quad PF,6+ Mos 01/10/2015, 01/10/2016, 03/07/2017, 12/10/2017  . Influenza-Unspecified 01/11/2013, 01/12/2014  . Rabies, IM 01/21/2017, 01/24/2017, 01/28/2017, 02/04/2017  . Tdap 06/29/2007    Health Maintenance  Topic Date Due  . HIV Screening  03/20/1973  . PAP SMEAR-Modifier  10/27/2013  . TETANUS/TDAP  06/28/2017  . MAMMOGRAM  05/05/2020  . COLONOSCOPY  04/19/2022  . INFLUENZA VACCINE  Completed  . Hepatitis C Screening  Completed     Discussed health benefits of physical activity, and encouraged her to engage in regular exercise appropriate for her age and condition.    1. Annual physical exam  - CBC with Differential/Platelet - Comprehensive metabolic panel - Lipid Panel With LDL/HDL Ratio - TSH  2. Borderline diabetes  - Hemoglobin A1c  3. Other depression PHQ9 today patient scored 9.  Patient is emotionally exhausted with her elderly very frail and ill mother has moved into her home from [place].  This is greatly disrupting her life with her husband.  4. Anxiety  - ALPRAZolam (XANAX) 0.5 MG tablet; 1 to 2 tablets daily as needed  Dispense: 60 tablet; Refill: 3  5. Extreme obesity Patient does not think she has sleep apnea but husband states that he thinks she has occasional apneic spells.  Epworth today is 5.  She states she rarely snores.  6. BMI 40.0-44.9, adult ([place])   7. Valvular heart disease  - Ambulatory referral to Cardiology  8. Hx of skin cancer, basal cell  - Ambulatory referral to Dermatology  9. Need for tetanus booster   10. Caregiver burden  - Ambulatory referral to Chronic Care Management Services   HPI, Exam and A&P Transcribed under the direction and in the presence of Miguel Aschoff, Brooke Bonito., MD. Electronically Signed: Althea Charon, RMA I have done the exam and reviewed the chart and it is accurate to the best of my knowledge. Development worker, community has been used and  any errors in dictation or transcription are unintentional. Miguel Aschoff M.D. [facility]

## 2018-06-15 ENCOUNTER — Telehealth: Payer: Self-pay

## 2018-06-15 LAB — COMPREHENSIVE METABOLIC PANEL
ALBUMIN: 4.5 g/dL (ref 3.8–4.9)
ALK PHOS: 96 IU/L (ref 39–117)
ALT: 14 IU/L (ref 0–32)
AST: 15 IU/L (ref 0–40)
Albumin/Globulin Ratio: 1.7 (ref 1.2–2.2)
BUN / CREAT RATIO: 25 (ref 12–28)
BUN: 15 mg/dL (ref 8–27)
Bilirubin Total: 0.3 mg/dL (ref 0.0–1.2)
CO2: 23 mmol/L (ref 20–29)
Calcium: 10.1 mg/dL (ref 8.7–10.3)
Chloride: 101 mmol/L (ref 96–106)
Creatinine, Ser: 0.6 mg/dL (ref 0.57–1.00)
GFR calc Af Amer: 115 mL/min/{1.73_m2} (ref >59)
GFR calc non Af Amer: 99 mL/min/{1.73_m2} (ref >59)
GLUCOSE: 88 mg/dL (ref 65–99)
Globulin, Total: 2.7 g/dL (ref 1.5–4.5)
Potassium: 4.4 mmol/L (ref 3.5–5.2)
Sodium: 140 mmol/L (ref 134–144)
TOTAL PROTEIN: 7.2 g/dL (ref 6.0–8.5)

## 2018-06-15 LAB — CBC WITH DIFFERENTIAL/PLATELET
BASOS ABS: 0.1 10*3/uL (ref 0.0–0.2)
Basos: 1 %
EOS (ABSOLUTE): 0.1 10*3/uL (ref 0.0–0.4)
EOS: 2 %
HEMOGLOBIN: 13.7 g/dL (ref 11.1–15.9)
Hematocrit: 39.5 % (ref 34.0–46.6)
IMMATURE GRANS (ABS): 0 10*3/uL (ref 0.0–0.1)
IMMATURE GRANULOCYTES: 0 %
Lymphocytes Absolute: 2.4 10*3/uL (ref 0.7–3.1)
Lymphs: 29 %
MCH: 29.9 pg (ref 26.6–33.0)
MCHC: 34.7 g/dL (ref 31.5–35.7)
MCV: 86 fL (ref 79–97)
MONOS ABS: 0.7 10*3/uL (ref 0.1–0.9)
Monocytes: 8 %
NEUTROS PCT: 60 %
Neutrophils Absolute: 5.2 10*3/uL (ref 1.4–7.0)
Platelets: 356 10*3/uL (ref 150–450)
RBC: 4.58 x10E6/uL (ref 3.77–5.28)
RDW: 13.4 % (ref 11.7–15.4)
WBC: 8.6 10*3/uL (ref 3.4–10.8)

## 2018-06-15 LAB — TSH: TSH: 1.18 u[IU]/mL (ref 0.450–4.500)

## 2018-06-15 LAB — HEMOGLOBIN A1C
Est. average glucose Bld gHb Est-mCnc: 120 mg/dL
HEMOGLOBIN A1C: 5.8 % — AB (ref 4.8–5.6)

## 2018-06-15 LAB — LIPID PANEL WITH LDL/HDL RATIO
Cholesterol, Total: 209 mg/dL — ABNORMAL HIGH (ref 100–199)
HDL: 66 mg/dL (ref >39)
LDL Calculated: 127 mg/dL — ABNORMAL HIGH (ref 0–99)
LDL/HDL RATIO: 1.9 ratio (ref 0.0–3.2)
TRIGLYCERIDES: 81 mg/dL (ref 0–149)
VLDL Cholesterol Cal: 16 mg/dL (ref 5–40)

## 2018-06-15 NOTE — Telephone Encounter (Signed)
-----   Message from Jerrol Banana., MD sent at 06/15/2018  1:46 PM EDT ----- Labs stable

## 2018-06-15 NOTE — Telephone Encounter (Signed)
Pt advised.   Thanks,   -Bricyn Labrada  

## 2018-06-19 ENCOUNTER — Ambulatory Visit: Payer: Self-pay | Admitting: Pharmacist

## 2018-06-19 DIAGNOSIS — F419 Anxiety disorder, unspecified: Secondary | ICD-10-CM

## 2018-06-19 NOTE — Patient Instructions (Signed)
Andrea Beard was given information about Care Management services today including:  1. Case Management services includes personalized support from designated clinical staff supervised by her physician, including individualized plan of care and coordination with other care providers 2. 24/7 contact phone numbers for assistance for urgent and routine care needs. 3. The patient may stop case management services at any time by phone call to the office staff.  Patient agreed to services and verbal consent obtained.

## 2018-06-19 NOTE — Chronic Care Management (AMB) (Signed)
  Care Management   Note  06/19/2018 Name: ALPHA CHOUINARD MRN: [id number] DOB: Apr 16, 1955  SHALONDA SACHSE is a 63 y.o. year old female who sees Jerrol Banana., MD for primary care. Dr. Rosanna Randy asked the CCM team to consult the patient for assistance with social needs related to caregiver burden.. Referral was placed 06/14/18. Telephone outreach to patient today to introduce care management services.   Plan: Ms. Rosete agreed that care management services would be helpful to him/her and verbal consent was obtained. An initial telephone outreach with [place] will occur later this week    Ms. Dicarlo was given information about Care Management services today including:  1. Case Management services includes personalized support from designated clinical staff supervised by her physician, including individualized plan of care and coordination with other care providers 2. 24/7 contact phone numbers for assistance for urgent and routine care needs. 3. The patient may stop case management services at any time by phone call to the office staff.  Patient agreed to services and verbal consent obtained.    Ruben Reason, PharmD Clinical Pharmacist [place] [phone]

## 2018-06-21 ENCOUNTER — Telehealth: Payer: Self-pay

## 2018-06-22 ENCOUNTER — Other Ambulatory Visit: Payer: Self-pay

## 2018-06-22 ENCOUNTER — Ambulatory Visit: Payer: Self-pay | Admitting: *Deleted

## 2018-06-22 ENCOUNTER — Telehealth: Payer: Self-pay

## 2018-06-22 DIAGNOSIS — F32A Depression, unspecified: Secondary | ICD-10-CM

## 2018-06-22 DIAGNOSIS — F419 Anxiety disorder, unspecified: Secondary | ICD-10-CM

## 2018-06-22 DIAGNOSIS — F329 Major depressive disorder, single episode, unspecified: Secondary | ICD-10-CM

## 2018-06-22 NOTE — Chronic Care Management (AMB) (Signed)
    Care Management    Clinical Social Work General Note  06/22/2018 Name: Andrea Beard MRN: [id number] DOB: 04-28-1955  Andrea Beard is a 63 y.o. year old female who is a primary care patient of Jerrol Banana., MD. The CCM team was consulted for assistance with Caregiver Stress.   Review of patient status, including review of consultants reports, relevant laboratory and other test results, and collaboration with appropriate care team members and the patient's provider was performed as part of comprehensive patient evaluation and provision of chronic care management services.    Goals Addressed            This Visit's Progress   . "I feel that my mother needs more care" (pt-stated)       This social worker contacted patient today to discuss options for additional care and assistance for her mother. Per patient, due to her mother's medical condition she moved from [place] to live with patient and her husband approximately 6 months ago. Per patient, her mother has been hospitalized twice since her move.  Per patient, her mother is receiving home health services, however that is the extent of her in home care. Patient reports realizing that her mother is eventually going to require more care. Patient admits to caregiver strain and is open to discussing options for additional support. Patient aware and verbalized understanding of potential limits and delays regarding available resources during this time.  Current Barriers:  . Limited social support . Level of care concerns . Lacks knowledge of community resource: related to caregiver support  Clinical Social Work Clinical Goal(s):  Marland Kitchen Over the next 30 days, client will work with SW to address concerns related to available community resources to assist her in providing care for her mother  Interventions: . Patient interviewed and appropriate assessments performed . Allowed patient to vent her  frustrations and provided  her with emotional support . Discussed warning signs of caregiver strain, emphasizing self care. . Provided patient with information about various options related to caregiver support including respite care, personal care services, adult day programs and facility care to avoid caregiver strain . Discussed plans with patient for ongoing care management follow up and provided patient with direct contact information for care management team  Patient Self Care Activities:  . Attends all scheduled provider appointments  Initial goal documentation          Follow Up Plan: SW will follow up with patient by phone over the next 30 days regarding status of available resources     [place], [place] Worker  [place] Care Management [phone]

## 2018-06-22 NOTE — Patient Instructions (Signed)
Thank you allowing the Chronic Care Management Team to be a part of your care! It was a pleasure speaking with you today!  1. Please continue to consider strategies for self care to manage caregiver stress. 2. Please remain open to additional care options such as respite care, in home care or adult day programs to help avoid caregiver strain.  CCM (Chronic Care Management) Team   Trish Fountain RN, BSN Nurse Care Coordinator  [phone]  Ruben Reason PharmD  Clinical Pharmacist  [phone]   Elliot Gurney, LCSW Clinical Social Worker [phone]  Goals Addressed            This Visit's Progress   . "I feel that my mother needs more care" (pt-stated)       Current Barriers:  . Limited social support . Level of care concerns . Lacks knowledge of community resource: related to caregiver support  Clinical Social Work Clinical Goal(s):  Marland Kitchen Over the next 30 days, client will work with SW to address concerns related to available community resources to assist her in providing care for her mother  Interventions: . Patient interviewed and appropriate assessments performed . Allowed patient to vent her  frustrations and provided her with emotional support . Discussed warning signs of caregiver strain, emphasizing self care. . Provided patient with information about various options related to caregiver support including respite care, personal care services, adult day programs and facility care to avoid caregiver strain. . Discussed possible delays in available community options due to the current pandemic and limit on social gatherings . Discussed plans with patient for ongoing care management follow up and provided patient with direct contact information for care management team  Patient Self Care Activities:  . Attends all scheduled provider appointments  Initial goal documentation         The patient verbalized understanding of instructions provided today and  declined a print copy of patient instruction materials.   The CM team will reach out to the patient again over the next 30 days.

## 2018-06-30 ENCOUNTER — Ambulatory Visit: Payer: Self-pay | Admitting: *Deleted

## 2018-06-30 DIAGNOSIS — F329 Major depressive disorder, single episode, unspecified: Secondary | ICD-10-CM

## 2018-06-30 DIAGNOSIS — F32A Depression, unspecified: Secondary | ICD-10-CM

## 2018-06-30 DIAGNOSIS — F419 Anxiety disorder, unspecified: Secondary | ICD-10-CM

## 2018-06-30 NOTE — Progress Notes (Signed)
Care Management    Clinical Social Work Follow Up Note  06/30/2018 Name: Andrea Beard MRN: [id number] DOB: 02/19/1956   Error, note created in error

## 2018-06-30 NOTE — Chronic Care Management (AMB) (Signed)
   Care Management    Clinical Social Work Follow Up Note  06/30/2018 Name: Andrea Beard MRN: [id number] DOB: December 04, 1955  Andrea Beard is a 63 y.o. year old female who is a primary care patient of Jerrol Banana., MD. The CCM team was consulted for assistance with Caregiver Stress.   Review of patient status, including review of consultants reports, other relevant assessments, and collaboration with appropriate care team members and the patient's provider was performed as part of comprehensive patient evaluation and provision of chronic care management services.     Goals Addressed            This Visit's Progress   . "I feel that my mother needs more care" (pt-stated)       This social worker spoke to patient by phone today. Patient discussed that her mother had been take to the emergency room today. Patient  is feeling that her mother may need more than a day program or respite. This Education officer, museum provided patient with emotional support, discussed placement options and anticipated cost of long term care and probable need for her to apply for Medicaid on her mother's behalf.   Current Barriers:  . Limited social support . Level of care concerns . Lacks knowledge of community resource: related to caregiver support  Clinical Social Work Clinical Goal(s):  Marland Kitchen Over the next 30 days, client will work with SW to address concerns related to available community resources to assist her in providing care for her mother  Interventions: . Patient interviewed and appropriate assessments performed . Allowed patient to vent her  frustrations and provided emotional support related to caregiver strain. . Discussed patient's mother's current ED admission and possible outcome . Discussed need to pursue long term care medicaid application for patient's mother. . Discussed plans with patient for ongoing care management follow up and provided patient with direct contact information for  care management team  Patient Self Care Activities:  . Attends all scheduled provider appointments  Please see past updates related to this goal by clicking on the "Past Updates" button in the selected goal          Follow Up Plan: SW will follow up with patient by phone over the next 7 days   [place], [place] Worker  [facility] Care Management [phone]

## 2018-06-30 NOTE — Patient Instructions (Signed)
Thank you allowing the Chronic Care Management Team to be a part of your care! It was a pleasure speaking with you today!  1. Please contact your CCM social worker with update on placement needs for your family member. 2. Living Will/HCPOA document will be emailed to [email or web address]  CCM (Chronic Care Management) Team   Trish Fountain RN, BSN Nurse Care Coordinator  [phone]  Ruben Reason PharmD  Clinical Pharmacist  [phone]   Elliot Gurney, LCSW Clinical Social Worker [phone]  Goals Addressed            This Visit's Progress   . "I feel that my mother needs more care" (pt-stated)       Current Barriers:  . Limited social support . Level of care concerns . Lacks knowledge of community resource: related to caregiver support  Clinical Social Work Clinical Goal(s):  Marland Kitchen Over the next 30 days, client will work with SW to address concerns related to available community resources to assist her in providing care for her mother  Interventions: . Patient interviewed and appropriate assessments performed . Allowed patient to vent her  frustrations and provided emotional support related to caregiver strain. . Discussed patient's mother's current ED admission and possible outcome . Discussed need to pursue long term care medicaid application for patient's mother. . Discussed plans with patient for ongoing care management follow up and provided patient with direct contact information for care management team  Patient Self Care Activities:  . Attends all scheduled provider appointments  Please see past updates related to this goal by clicking on the "Past Updates" button in the selected goal          The patient verbalized understanding of instructions provided today and declined a print copy of patient instruction materials.   The CM team will reach out to the patient again over the next 7 days.

## 2018-07-06 ENCOUNTER — Telehealth: Payer: Self-pay | Admitting: *Deleted

## 2018-07-07 ENCOUNTER — Telehealth: Payer: Self-pay

## 2018-07-07 ENCOUNTER — Other Ambulatory Visit: Payer: Self-pay

## 2018-07-07 ENCOUNTER — Ambulatory Visit: Payer: Self-pay | Admitting: *Deleted

## 2018-07-07 DIAGNOSIS — F32A Depression, unspecified: Secondary | ICD-10-CM

## 2018-07-07 DIAGNOSIS — F329 Major depressive disorder, single episode, unspecified: Secondary | ICD-10-CM

## 2018-07-07 DIAGNOSIS — F419 Anxiety disorder, unspecified: Secondary | ICD-10-CM

## 2018-07-07 NOTE — Chronic Care Management (AMB) (Signed)
  Chronic Care Management    Clinical Social Work Follow Up Note  07/07/2018 Name: Andrea Beard MRN: [id number] DOB: 03-08-1956  Andrea Beard is a 63 y.o. year old female who is a primary care patient of Jerrol Banana., MD. The CCM team was consulted for assistance with Caregiver Stress.   Patient states that her stress level has improved due to the fact that her husband has played more of an active role in providing care for her mother.  Per patient, "I am enjoying my husband being home". Patient also appreciates the additional support from [facility]. Patient reports that she received the information e-mailed to her regarding her mother's Advanced Directives/Living Will and states that he mother had one done previously and will plan to bring it to the office to be scanned.  Review of patient status, including review of consultants reports, other relevant assessments, and collaboration with appropriate care team members and the patient's provider was performed as part of comprehensive patient evaluation and provision of chronic care management services.       Goals Addressed            This Visit's Progress   . "I feel that my mother needs more care" (pt-stated)       Current Barriers:  . Limited social support . Level of care concerns . Lacks knowledge of community resource: related to caregiver support  Clinical Social Work Clinical Goal(s):  Marland Kitchen Over the next 30 days, client will work with SW to address concerns related to available community resources to assist her in providing care for her mother  Interventions: . Patient interviewed and appropriate assessments performed . Discussed patient's mother's current medical condition, progress made and recommendation/meaning of Palliative Care . Explored patient's current mood, support network and current coping. . Discussed plans with patient for ongoing care management follow up and provided patient with  direct contact information for care management team . Confirmed that she received the information on Advanced Directives through secure email  Patient Self Care Activities:  . Attends all scheduled provider appointments  Please see past updates related to this goal by clicking on the "Past Updates" button in the selected goal          Follow Up Plan: SW will follow up with patient by phone over the next 2 weeks    [place], [place] Worker  [facility] Care Management [phone]

## 2018-07-07 NOTE — Patient Instructions (Signed)
Thank you allowing the Chronic Care Management Team to be a part of your care! It was a pleasure speaking with you today!  1. Please continue to utilize positive coping and available supports to continue to reduce caregiver stress. 2. This Education officer, museum to follow up with you within 2 weeks.  CCM (Chronic Care Management) Team   Trish Fountain RN, BSN Nurse Care Coordinator  [phone]  Ruben Reason PharmD  Clinical Pharmacist  [phone]   Elliot Gurney, LCSW Clinical Social Worker [phone]  Goals Addressed            This Visit's Progress   . "I feel that my mother needs more care" (pt-stated)       Current Barriers:  . Limited social support . Level of care concerns . Lacks knowledge of community resource: related to caregiver support  Clinical Social Work Clinical Goal(s):  Marland Kitchen Over the next 30 days, client will work with SW to address concerns related to available community resources to assist her in providing care for her mother  Interventions: . Patient interviewed and appropriate assessments performed . Discussed patient's mother's current medical condition, progress made and recommendation/meaning of Palliative Care . Explored patient's current mood, support network and current coping. . Discussed plans with patient for ongoing care management follow up and provided patient with direct contact information for care management team . Confirmed that she received the information on Advanced Directives through secure email  Patient Self Care Activities:  . Attends all scheduled provider appointments  Please see past updates related to this goal by clicking on the "Past Updates" button in the selected goal          The patient verbalized understanding of instructions provided today and declined a print copy of patient instruction materials.   This Education officer, museum will follow up with patient within the next 2 weeks.   Elliot Gurney, [place] [facility] [phone]

## 2018-07-19 ENCOUNTER — Ambulatory Visit: Payer: Self-pay | Admitting: *Deleted

## 2018-07-19 DIAGNOSIS — F419 Anxiety disorder, unspecified: Secondary | ICD-10-CM

## 2018-07-19 DIAGNOSIS — F329 Major depressive disorder, single episode, unspecified: Secondary | ICD-10-CM

## 2018-07-19 NOTE — Patient Instructions (Signed)
Thank you allowing the Chronic Care Management Team to be a part of your care! It was a pleasure speaking with you today!  1. Please continue to consider care assistance options discussed today for your family member. 2. Please continue to use desired self care strategies discussed today. 3. Please continue to follow up with medicaid application process for your family member.    CCM (Chronic Care Management) Team   Trish Fountain RN, BSN Nurse Care Coordinator  [phone]  Ruben Reason PharmD  Clinical Pharmacist  [phone]   Elliot Gurney, LCSW Clinical Social Worker [phone]  Goals Addressed            This Visit's Progress   . "I feel that my mother needs more care" (pt-stated)       Current Barriers:  . Limited social support . Level of care concerns . Lacks knowledge of community resource: related to caregiver support  Clinical Social Work Clinical Goal(s):  Marland Kitchen Over the next 30 days, client will work with SW to address concerns related to available community resources to assist her in providing care for her mother  Interventions: . Patient interviewed and appropriate assessments performed . Discussed patient's mother's increased care needs and options for additional help in the home, including long term care Medicaid . Discussed result's of patient's mother's Palliative Care consult  . Continued to explore and process patient's current symptoms of anxiety, support network and current coping in regards to caregiver stress. . Discussed plans with patient for ongoing care management follow up and provided patient with direct contact information for care management team   Patient Self Care Activities:  . Attends all scheduled provider appointments  Please see past updates related to this goal by clicking on the "Past Updates" button in the selected goal          The patient verbalized understanding of instructions provided today and declined a  print copy of patient instruction materials.   The CM team will reach out to the patient again over the next 14 days.

## 2018-07-19 NOTE — Chronic Care Management (AMB) (Signed)
  Chronic Care Management    Clinical Social Work Follow Up Note  07/19/2018 Name: Andrea Beard MRN: [id number] DOB: [birth year]  Andrea Beard is a 63 y.o. year old female who is a primary care patient of Jerrol Banana., MD. The CCM team was consulted for assistance with Caregiver Stress.   Review of patient status, including review of consultants reports, other relevant assessments, and collaboration with appropriate care team members and the patient's provider was performed as part of comprehensive patient evaluation and provision of chronic care management services.     Goals Addressed            This Visit's Progress   . "I feel that my mother needs more care" (pt-stated)       Patient discussed beginning to realize that her mother is in need of more care. Per patient, her mother's pneumonia and cellulitis continue to re-occur.  Patient continues to struggle with making the decision regarding how to best care for her mother. Personal care services as well as, day treatment and facility care discussed. Patient's anxiety regarding this decision processed.   Current Barriers:  . Limited social support . Level of care concerns . Lacks knowledge of community resource: related to caregiver support  Clinical Social Work Clinical Goal(s):  Marland Kitchen Over the next 30 days, client will work with SW to address concerns related to available community resources to assist her in providing care for her mother  Interventions: . Patient interviewed and appropriate assessments performed . Discussed patient's mother's increased care needs and options for additional help in the home. . Discussed result's of patient's mother's Palliative Care consult  . Continued to explore and process patient's current symptoms of anxiety, support network and current coping in regards to caregiver stress. . Discussed plans with patient for ongoing care management follow up and provided patient with direct  contact information for care management team   Patient Self Care Activities:  . Attends all scheduled provider appointments  Please see past updates related to this goal by clicking on the "Past Updates" button in the selected goal          Follow Up Plan: SW will follow up with patient by phone over the next 2 weeks    [place], [place] Worker  [facility] Care Management [phone]

## 2018-07-31 ENCOUNTER — Telehealth: Payer: Self-pay

## 2018-07-31 DIAGNOSIS — I340 Nonrheumatic mitral (valve) insufficiency: Secondary | ICD-10-CM

## 2018-07-31 DIAGNOSIS — I38 Endocarditis, valve unspecified: Secondary | ICD-10-CM

## 2018-07-31 NOTE — Telephone Encounter (Signed)
Ok to refer? Please advise.

## 2018-07-31 NOTE — Telephone Encounter (Signed)
Pt needs a referral to Dr. Kandis Cocking [facility] Cardiology.   Contact number [phone]  Thanks,   -Mickel Baas

## 2018-08-01 NOTE — Telephone Encounter (Signed)
ok 

## 2018-08-01 NOTE — Telephone Encounter (Signed)
Left message to clarify why a referral was needed to cardiology.

## 2018-08-02 ENCOUNTER — Telehealth: Payer: Self-pay

## 2018-08-02 NOTE — Telephone Encounter (Signed)
Order placed as below.

## 2018-08-04 ENCOUNTER — Encounter: Payer: Self-pay | Admitting: *Deleted

## 2018-08-04 ENCOUNTER — Ambulatory Visit: Payer: Self-pay | Admitting: *Deleted

## 2018-08-04 DIAGNOSIS — F329 Major depressive disorder, single episode, unspecified: Secondary | ICD-10-CM

## 2018-08-04 DIAGNOSIS — F419 Anxiety disorder, unspecified: Secondary | ICD-10-CM

## 2018-08-04 NOTE — Chronic Care Management (AMB) (Signed)
  Chronic Care Management    Clinical Social Work General Follow Up Note  08/04/2018 Name: Andrea Beard MRN: [id number] DOB: [birth year]  Andrea Beard is a 63 y.o. year old female who is a primary care patient of Jerrol Banana., MD. The CCM team was consulted for assistance with Mental Health Counseling and Resources.    Review of patient status, including review of consultants reports, relevant laboratory and other test results, and collaboration with appropriate care team members and the patient's provider was performed as part of comprehensive patient evaluation and provision of chronic care management services.    Goals Addressed            This Visit's Progress   . "I feel that my mother needs more care" (pt-stated)       Phone call to patient today to follow up on her caregiver role and level of anxiety and stress. Per patient, her mother's condition is stable now, she is a little bit stronger and more self sufficient. Palliative care is calling weekly and patient's mother is now followed by the CCM RNCM. Per patient, HH came last Wednesday for their last session with her mother. Patient has not pursued medicaid for her mother, as she feels that with the Palliative Care and CCM Team she feels she can continue to provide care.  Per patient, her anxiety level has decreased as her mother's condition has now improved and their is support in place to contact if needed for help. Patient further reports practicing self care by taking more time for herself. She has re-arranged her home where she has a private office in a spare room where she can go for privacy.  She has also begun to leave the room, when her mother is receiving [facility] services for additional breaks.     Current Barriers:  . Limited social support . Level of care concerns . Lacks knowledge of community resource: related to caregiver support  Clinical Social Work Clinical Goal(s):  Marland Kitchen Over the next 30 days,  client will work with SW to address concerns related to available community resources to assist her in providing care for her mother  Interventions: . Patient interviewed and appropriate assessments performed . Discussed patient's mother's current medical status and supports that have been put in place . Explored patient's current anxiety level and healthy coping utilized to manage it. . Provided continued encouragement to practice self care . Discussed plans with patient for ongoing care management follow up and provided patient with direct contact information for care management team   Patient Self Care Activities:  . Attends all scheduled provider appointments  Please see past updates related to this goal by clicking on the "Past Updates" button in the selected goal           Follow Up Plan: SW will follow up with patient by phone over the next month     [place], [place] Worker  [facility] Care Management [phone]

## 2018-08-04 NOTE — Patient Instructions (Addendum)
Thank you allowing the Chronic Care Management Team to be a part of your care! It was a pleasure speaking with you today!  1. Please continue to practice desired self care activities to manage her anxiety. 2. Please call this social worker with any questions or concerns.       CCM (Chronic Care Management) Team   Trish Fountain RN, BSN Nurse Care Coordinator  [phone]  Ruben Reason PharmD  Clinical Pharmacist  [phone]   Elliot Gurney, LCSW Clinical Social Worker [phone]  Goals Addressed            This Visit's Progress   . "I feel that my mother needs more care" (pt-stated)       Current Barriers:  . Limited social support . Level of care concerns . Lacks knowledge of community resource: related to caregiver support  Clinical Social Work Clinical Goal(s):  Marland Kitchen Over the next 30 days, client will work with SW to address concerns related to available community resources to assist her in providing care for her mother  Interventions: . Patient interviewed and appropriate assessments performed . Discussed patient's mother's current medical status and supports that have been put in place . Explored patient's current anxiety level and healthy coping utilized to manage it. . Provided continued encouragement to practice self care . Discussed plans with patient for ongoing care management follow up and provided patient with direct contact information for care management team   Patient Self Care Activities:  . Attends all scheduled provider appointments  Please see past updates related to this goal by clicking on the "Past Updates" button in the selected goal          The patient verbalized understanding of instructions provided today and declined a print copy of patient instruction materials.   The CM team will reach out to the patient again over the next 30 days.

## 2018-09-04 IMAGING — US ULTRASOUND RIGHT BREAST LIMITED
1 series · 6 of 6 positions shown · non-contrast
Comparison: Previous exam(s).

CLINICAL DATA: Right breast ultrasound. Follow-up right breast
mass.

EXAM:
ULTRASOUND OF THE RIGHT BREAST

[Series 1: ultrasound right breast limited · 0.06mm/px · 6 of 6 slices shown]
[im 1/6]
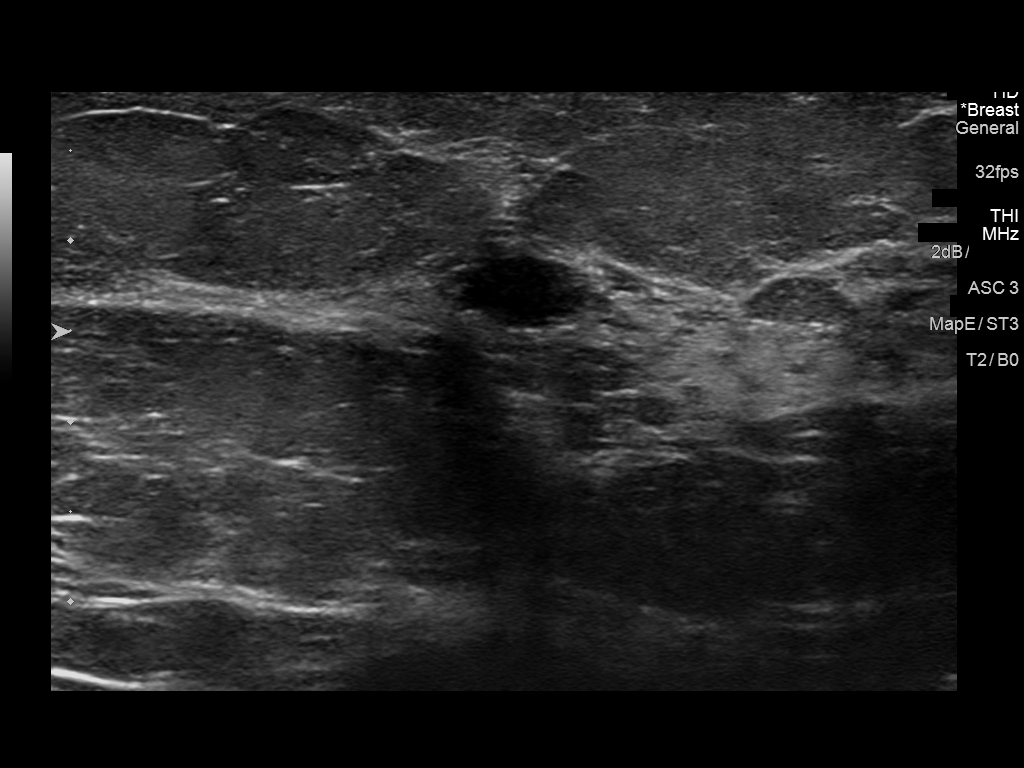
[im 2/6]
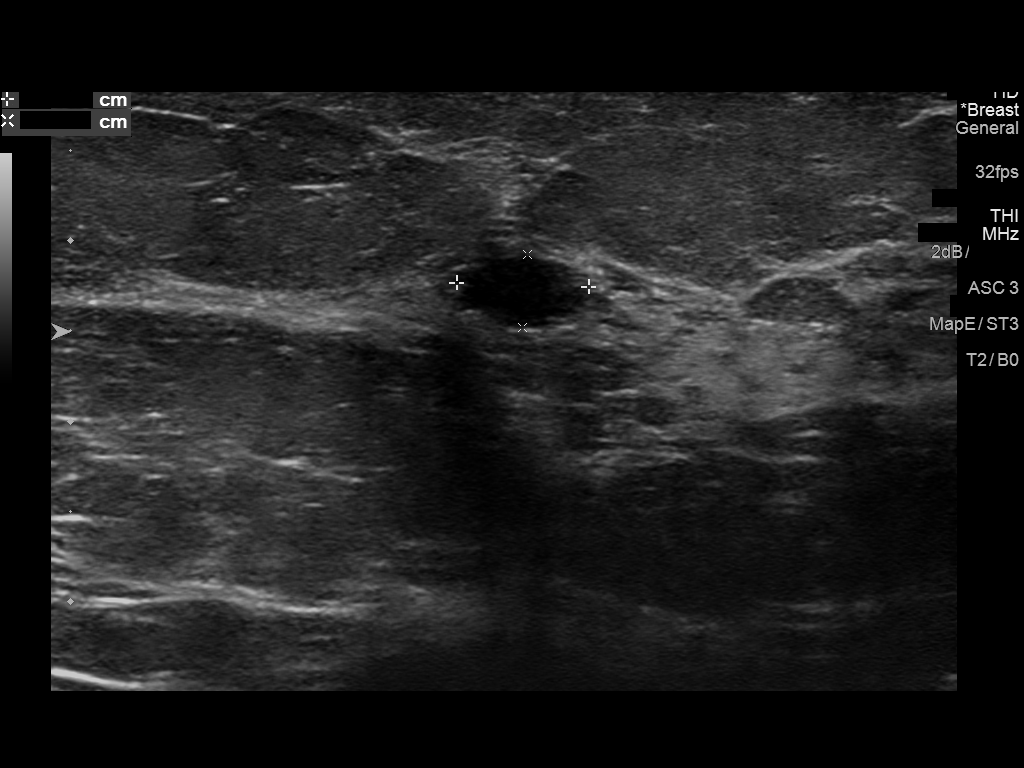
[im 3/6]
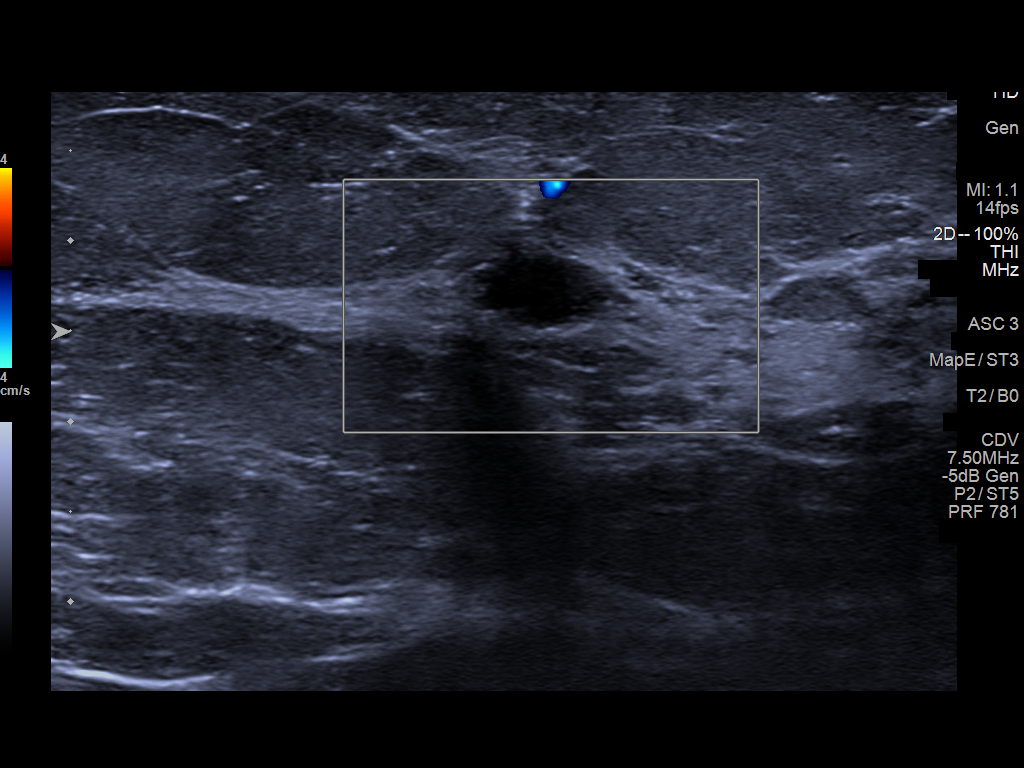
[im 4/6]
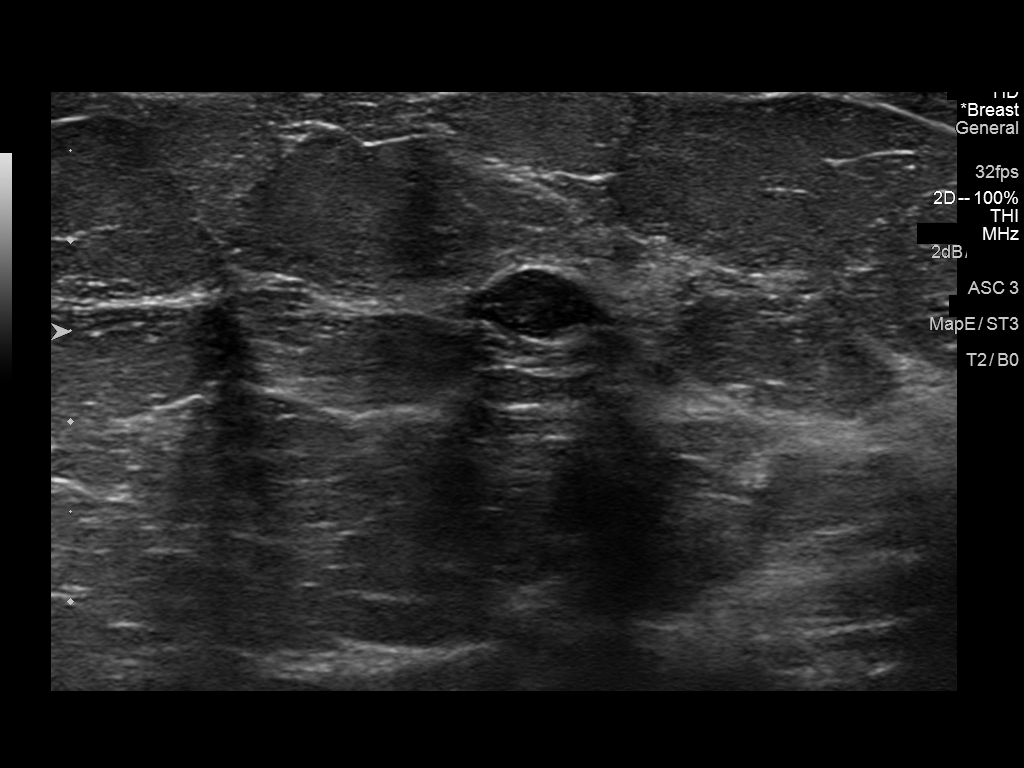
[im 5/6]
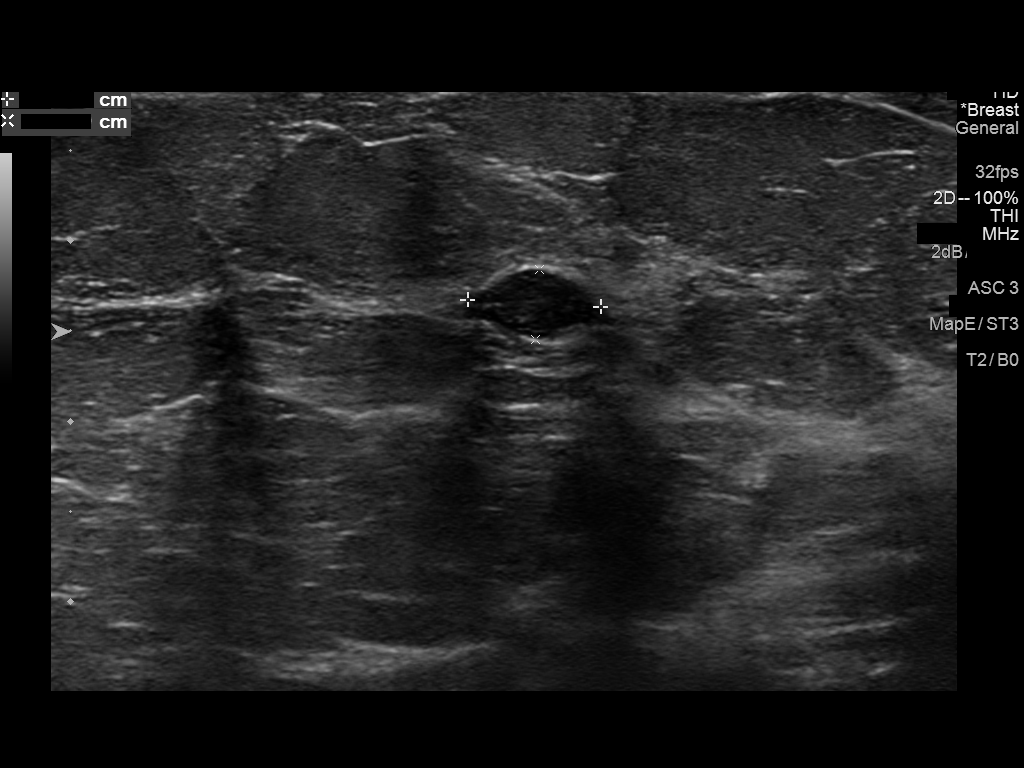
[im 6/6]
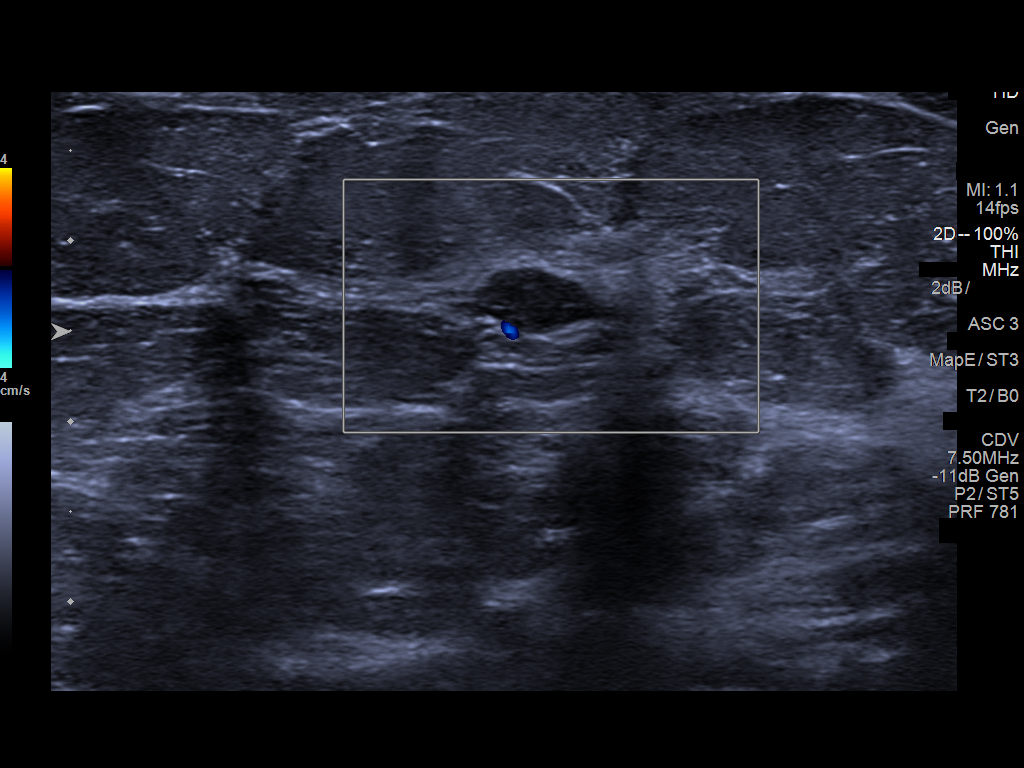

[6 of 6 positions shown; findings below may reference images not displayed]

FINDINGS: On physical exam, no suspicious lumps are identified.

Targeted ultrasound is performed, showing a hypoechoic mass at 9
o'clock, 3 cm from the nipple measuring 7.4 x 3.9 by 7.3 mm today
versus 7.5 by 3.5 x 7.4 mm in April 2017.
IMPRESSION: The right breast mass is stable.

RECOMMENDATION:
The patient questioned the decision to follow the mass since it was
new. We discussed the options of follow-up versus aspiration/biopsy.
It was decided to attempt a cyst aspiration. If the mass does not
aspirate, recommend biopsy.

I have discussed the findings and recommendations with the patient.
Results were also provided in writing at the conclusion of the
visit. If applicable, a reminder letter will be sent to the patient
regarding the next appointment.

BI-RADS CATEGORY  4: Suspicious.

## 2018-09-08 ENCOUNTER — Ambulatory Visit: Payer: Self-pay | Admitting: *Deleted

## 2018-09-08 DIAGNOSIS — F419 Anxiety disorder, unspecified: Secondary | ICD-10-CM

## 2018-09-08 DIAGNOSIS — Z636 Dependent relative needing care at home: Secondary | ICD-10-CM

## 2018-09-08 NOTE — Patient Instructions (Signed)
Thank you allowing the Chronic Care Management Team to be a part of your care! It was a pleasure speaking with you today!  1. Please feel free to call this social worker with any additional community resource needs or concerns .      CCM (Chronic Care Management) Team   Trish Fountain RN, BSN Nurse Care Coordinator  [phone]  Ruben Reason PharmD  Clinical Pharmacist  [phone]   Elliot Gurney, LCSW Clinical Social Worker [phone]  Goals Addressed            This Visit's Progress   . "I feel that my mother needs more care" (pt-stated)       Current Barriers:  . Limited social support . Level of care concerns . Lacks knowledge of community resource: related to caregiver support  Clinical Social Work Clinical Goal(s):  Marland Kitchen Over the next 30 days, client will work with SW to address concerns related to available community resources to assist her in providing care for her mother  Interventions: . Patient interviewed and appropriate assessments performed . Discussed patient's mother's current medical status and how patient is coping with her duties as caregiver . Explored patient's current anxiety level and healthy coping utilized to manage it. . Provided continued encouragement to practice self care and utilize available supports . Discussed plans with patient for ongoing care management follow up and provided patient with direct contact information for care management team   Patient Self Care Activities:  . Attends all scheduled provider appointments  Please see past updates related to this goal by clicking on the "Past Updates" button in the selected goal          The patient verbalized understanding of instructions provided today and declined a print copy of patient instruction materials.   No further follow up required: patient agreed to call this social worker with any additonal needs in that may arise in the future

## 2018-09-08 NOTE — Chronic Care Management (AMB) (Signed)
   Care Management    Clinical Social Work Follow Up Note  09/08/2018 Name: Andrea Beard MRN: [id number] DOB: Feb 14, 1956  Andrea Beard is a 63 y.o. year old female who is a primary care patient of Jerrol Banana., MD. The CCM team was consulted for assistance with Caregiver Stress.   Review of patient status, including review of consultants reports, other relevant assessments, and collaboration with appropriate care team members and the patient's provider was performed as part of comprehensive patient evaluation and provision of chronic care management services.     Goals Addressed            This Visit's Progress   . "I feel that my mother needs more care" (pt-stated)       This social worker spoke to patient today by phone to follow up on her anxiety and stress level related to her caregiver responsibilities. Per patient, she is feeling much better "everything is fine, mom is doing well'. Per patient, they have made her mother's room very comfortable and she spends most of her time in there. Patient also really appreciates the assistance from her husband which helps a lot. Patient verbalized positive coping strategies to manage her stress and verbalized having no needs as this time.  Current Barriers:  . Limited social support . Level of care concerns . Lacks knowledge of community resource: related to caregiver support  Clinical Social Work Clinical Goal(s):  Marland Kitchen Over the next 30 days, client will work with SW to address concerns related to available community resources to assist her in providing care for her mother  Interventions: . Patient interviewed and appropriate assessments performed . Discussed patient's mother's current medical status and how patient is coping with her duties as caregiver . Explored patient's current anxiety level and healthy coping utilized to manage it. . Provided continued encouragement to practice self care and utilize available supports  . Discussed plans with patient for ongoing care management follow up and provided patient with direct contact information for care management team   Patient Self Care Activities:  . Attends all scheduled provider appointments  Please see past updates related to this goal by clicking on the "Past Updates" button in the selected goal          Follow Up Plan: Client will contact this social work if any additional needs arise in the future   Sheralyn Boatman [facility] Care Management [phone]

## 2018-09-20 ENCOUNTER — Ambulatory Visit: Payer: Self-pay | Admitting: Family Medicine

## 2018-11-17 ENCOUNTER — Telehealth: Payer: Self-pay | Admitting: Family Medicine

## 2018-11-17 DIAGNOSIS — Z20822 Contact with and (suspected) exposure to covid-19: Secondary | ICD-10-CM

## 2018-11-17 DIAGNOSIS — Z20828 Contact with and (suspected) exposure to other viral communicable diseases: Secondary | ICD-10-CM

## 2018-11-17 NOTE — Telephone Encounter (Signed)
Caregiver in the home has tested positive for Covid in the last 4 days.  Needing to know if she needs to be tested or advise what she needs to do.  Please call [name] back at [phone]  Thanks, [facility]

## 2018-11-17 NOTE — Telephone Encounter (Signed)
Per Dr. Rosanna Randy, test due to exposure. Patient was advised.

## 2018-11-18 ENCOUNTER — Other Ambulatory Visit: Payer: Self-pay

## 2018-11-18 DIAGNOSIS — Z20822 Contact with and (suspected) exposure to covid-19: Secondary | ICD-10-CM

## 2018-11-19 LAB — NOVEL CORONAVIRUS, NAA: SARS-CoV-2, NAA: NOT DETECTED

## 2018-11-23 ENCOUNTER — Other Ambulatory Visit: Payer: Self-pay

## 2018-11-23 DIAGNOSIS — Z20822 Contact with and (suspected) exposure to covid-19: Secondary | ICD-10-CM

## 2018-11-24 LAB — NOVEL CORONAVIRUS, NAA: SARS-CoV-2, NAA: NOT DETECTED

## 2018-12-19 ENCOUNTER — Ambulatory Visit (INDEPENDENT_AMBULATORY_CARE_PROVIDER_SITE_OTHER): Payer: BLUE CROSS/BLUE SHIELD

## 2018-12-19 DIAGNOSIS — Z23 Encounter for immunization: Secondary | ICD-10-CM

## 2019-01-24 ENCOUNTER — Telehealth: Payer: BLUE CROSS/BLUE SHIELD | Admitting: Nurse Practitioner

## 2019-01-24 DIAGNOSIS — J0100 Acute maxillary sinusitis, unspecified: Secondary | ICD-10-CM | POA: Diagnosis not present

## 2019-01-24 MED ORDER — AMOXICILLIN-POT CLAVULANATE 875-125 MG PO TABS: 1.0000 | ORAL_TABLET | Freq: Two times a day (BID) | ORAL | 0 refills | Status: DC

## 2019-01-24 NOTE — Progress Notes (Signed)

## 2019-02-20 NOTE — Progress Notes (Deleted)
   {  Method of visit:23308}  Patient: Andrea Beard Female    DOB: 12/20/1955   63 y.o.   MRN: [id number] Visit Date: 02/20/2019  Today's Provider: Wilhemena Durie, MD   No chief complaint on file.  Subjective:     HPI  Allergies  Allergen Reactions  . Oxycodone Rash     Current Outpatient Medications:  .  ALPRAZolam (XANAX) 0.5 MG tablet, 1 to 2 tablets daily as needed, Disp: 60 tablet, Rfl: 3 .  amoxicillin-clavulanate (AUGMENTIN) 875-125 MG tablet, Take 1 tablet by mouth 2 (two) times daily., Disp: 14 tablet, Rfl: 0 .  aspirin EC 81 MG tablet, Take 1 tablet (81 mg total) by mouth daily., Disp: 30 tablet, Rfl: 12 .  calcium carbonate (OSCAL) 1500 (600 Ca) MG TABS tablet, Take by mouth 2 (two) times daily with a meal., Disp: , Rfl:   Review of Systems  Constitutional: Negative for appetite change, chills, fatigue and fever.  Respiratory: Negative for chest tightness and shortness of breath.   Cardiovascular: Negative for chest pain and palpitations.  Gastrointestinal: Negative for abdominal pain, nausea and vomiting.  Neurological: Negative for dizziness and weakness.    Social History   Tobacco Use  . Smoking status: Never Smoker  . Smokeless tobacco: Never Used  Substance Use Topics  . Alcohol use: No    Comment: Non drinker/ No alcohol use.      Objective:   There were no vitals taken for this visit. There were no vitals filed for this visit.There is no height or weight on file to calculate BMI.   Physical Exam   No results found for any visits on 02/21/19.     Assessment & Plan        Wilhemena Durie, MD  [facility]

## 2019-02-21 ENCOUNTER — Ambulatory Visit (INDEPENDENT_AMBULATORY_CARE_PROVIDER_SITE_OTHER): Payer: BLUE CROSS/BLUE SHIELD | Admitting: Family Medicine

## 2019-02-21 ENCOUNTER — Other Ambulatory Visit: Payer: Self-pay

## 2019-02-21 ENCOUNTER — Ambulatory Visit: Payer: BLUE CROSS/BLUE SHIELD | Admitting: Family Medicine

## 2019-02-21 DIAGNOSIS — G5602 Carpal tunnel syndrome, left upper limb: Secondary | ICD-10-CM | POA: Diagnosis not present

## 2019-02-21 DIAGNOSIS — Z20822 Contact with and (suspected) exposure to covid-19: Secondary | ICD-10-CM

## 2019-02-21 MED ORDER — NAPROXEN 500 MG PO TABS
500.0000 mg | ORAL_TABLET | Freq: Two times a day (BID) | ORAL | 1 refills | Status: AC
Start: 2019-02-21 — End: ?

## 2019-02-21 NOTE — Progress Notes (Signed)
Patient: Andrea Beard Female    DOB: 11-05-1955   63 y.o.   MRN: [id number] Visit Date: 02/21/2019  Today's Provider: Wilhemena Durie, MD   No chief complaint on file.  Subjective:    Virtual Visit via Telephone Note  I connected with Andrea Beard on 02/21/19 at  1:40 PM EST by telephone and verified that I am speaking with the correct person using two identifiers.  Location: Patient: Home Provider: Office   HPI Right-handed patient has a been having tingling in her left hand for the past few weeks.  It occurs whenever she holds something for any length of time.  She feels like it actually goes from her hand back up towards her forearm.  She is not sure if the thumb is involved or not. She is a little concerned as her husband had a Covid exposure last week.  She and her husband were tested today.  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  The only other concern the patient has is that she wonders if there could be a lump in her breast or her axilla which is causing her problems  causing this numbness. Allergies  Allergen Reactions  . Oxycodone Rash     Current Outpatient Medications:  .  ALPRAZolam (XANAX) 0.5 MG tablet, 1 to 2 tablets daily as needed, Disp: 60 tablet, Rfl: 3 .  amoxicillin-clavulanate (AUGMENTIN) 875-125 MG tablet, Take 1 tablet by mouth 2 (two) times daily., Disp: 14 tablet, Rfl: 0 .  aspirin EC 81 MG tablet, Take 1 tablet (81 mg total) by mouth daily., Disp: 30 tablet, Rfl: 12 .  calcium carbonate (OSCAL) 1500 (600 Ca) MG TABS tablet, Take by mouth 2 (two) times daily with a meal., Disp: , Rfl:   Review of Systems  Constitutional: Negative for appetite change, chills, fatigue and fever.  HENT: Negative.   Eyes: Negative.   Respiratory: Negative for chest tightness and shortness of breath.   Cardiovascular: Negative for chest pain and palpitations.  Gastrointestinal: Negative for abdominal pain, nausea and  vomiting.  Endocrine: Negative.   Musculoskeletal: Negative for neck pain and neck stiffness.  Neurological: Negative for dizziness and weakness.  Psychiatric/Behavioral: Negative.     Social History   Tobacco Use  . Smoking status: Never Smoker  . Smokeless tobacco: Never Used  Substance Use Topics  . Alcohol use: No    Comment: Non drinker/ No alcohol use.      Objective:   There were no vitals taken for this visit. There were no vitals filed for this visit.There is no height or weight on file to calculate BMI.   Physical Exam   No results found for any visits on 02/21/19.     Assessment & Plan     1. Carpal tunnel syndrome on left Patient instructed to get a cock-up wrist splint to wear between now on December 1.  If no better by then will examine her in the office. - naproxen (NAPROSYN) 500 MG tablet; Take 1 tablet (500 mg total) by mouth 2 (two) times daily with a meal.  Dispense: 60 tablet; Refill: 1  I discussed the limitations, risks, security and privacy concerns of performing an evaluation and management service by telephone and the availability of in person appointments. I also discussed with the patient that there may be a patient responsible charge related to this service. The patient expressed understanding and agreed to proceed.  I discussed the assessment and treatment plan with the patient. The patient was provided an opportunity to ask questions and all were answered. The patient agreed with the plan and demonstrated an understanding of the instructions.   The patient was advised to call back or seek an in-person evaluation if the symptoms worsen or if the condition fails to improve as anticipated.  I provided 11 minutes of non-face-to-face time during this encounter.    Shateria Paternostro Cranford Mon, MD  [facility]

## 2019-02-22 LAB — NOVEL CORONAVIRUS, NAA: SARS-CoV-2, NAA: NOT DETECTED

## 2019-02-27 ENCOUNTER — Encounter: Payer: BLUE CROSS/BLUE SHIELD | Admitting: Obstetrics and Gynecology

## 2019-03-13 ENCOUNTER — Encounter: Payer: Self-pay | Admitting: Obstetrics and Gynecology

## 2019-03-13 ENCOUNTER — Other Ambulatory Visit: Payer: Self-pay

## 2019-03-13 ENCOUNTER — Ambulatory Visit (INDEPENDENT_AMBULATORY_CARE_PROVIDER_SITE_OTHER): Payer: BLUE CROSS/BLUE SHIELD | Admitting: Obstetrics and Gynecology

## 2019-03-13 VITALS — BP 128/76 | HR 67 | Ht 68.0 in | Wt 284.7 lb

## 2019-03-13 DIAGNOSIS — Z01419 Encounter for gynecological examination (general) (routine) without abnormal findings: Secondary | ICD-10-CM | POA: Diagnosis not present

## 2019-03-13 DIAGNOSIS — N8111 Cystocele, midline: Secondary | ICD-10-CM

## 2019-03-13 DIAGNOSIS — Z90710 Acquired absence of both cervix and uterus: Secondary | ICD-10-CM | POA: Diagnosis not present

## 2019-03-13 DIAGNOSIS — Z6841 Body Mass Index (BMI) 40.0 and over, adult: Secondary | ICD-10-CM

## 2019-03-13 DIAGNOSIS — Z1231 Encounter for screening mammogram for malignant neoplasm of breast: Secondary | ICD-10-CM

## 2019-03-13 NOTE — Progress Notes (Signed)
HPI:      Ms. Andrea Beard is a 63 y.o. [id number] who LMP was No LMP recorded. Patient has had a hysterectomy.  Subjective:   She presents today for her annual examination.  She states that she and her husband have been working on her libido issues and that seems to be going better.  She is in menopause and denies hot flashes.  She reports no pelvic problems at this time.    Hx: The following portions of the patient's history were reviewed and updated as appropriate:             She  has a past medical history of Anxiety, Arthritis, Arthropathy, Cancer ([place]), Depression, H/O bilateral hip replacements, Hypopotassemia, and Morbid obesity ([place]). She does not have any pertinent problems on file. She  has a past surgical history that includes Abdominal hysterectomy; Left Hip raplacement (Left 2008 and right 2010); Colonoscopy with propofol (N/A, 04/19/2017); Breast biopsy (Right); Breast biopsy (Right, 10/24/2017); and Breast biopsy (Right, 11/15/2017). Her family history includes Arthritis in her sister and sister; Bladder Cancer in her brother and brother; Breast cancer (age of onset: 64) in her mother; Cancer in her mother; Diabetes in her paternal grandfather; Esophageal cancer in her brother; Heart disease in her father; Hypertension in her father and mother; Melanoma in her brother; Stomach cancer in her brother; Thyroid disease in her brother. She  reports that she has never smoked. She has never used smokeless tobacco. She reports that she does not drink alcohol or use drugs. She has a current medication list which includes the following prescription(s): alprazolam, aspirin ec, calcium carbonate, and naproxen. She is allergic to oxycodone.       Review of Systems:  Review of Systems  Constitutional: Denied constitutional symptoms, night sweats, recent illness, fatigue, fever, insomnia and weight loss.  Eyes: Denied eye symptoms, eye pain, photophobia, vision change and visual disturbance.   Ears/Nose/Throat/Neck: Denied ear, nose, throat or neck symptoms, hearing loss, nasal discharge, sinus congestion and sore throat.  Cardiovascular: Denied cardiovascular symptoms, arrhythmia, chest pain/pressure, edema, exercise intolerance, orthopnea and palpitations.  Respiratory: Denied pulmonary symptoms, asthma, pleuritic pain, productive sputum, cough, dyspnea and wheezing.  Gastrointestinal: Denied, gastro-esophageal reflux, melena, nausea and vomiting.  Genitourinary: Denied genitourinary symptoms including symptomatic vaginal discharge, pelvic relaxation issues, and urinary complaints.  Musculoskeletal: Denied musculoskeletal symptoms, stiffness, swelling, muscle weakness and myalgia.  Dermatologic: Denied dermatology symptoms, rash and scar.  Neurologic: Denied neurology symptoms, dizziness, headache, neck pain and syncope.  Psychiatric: Denied psychiatric symptoms, anxiety and depression.  Endocrine: Denied endocrine symptoms including hot flashes and night sweats.   Meds:   Current Outpatient Medications on File Prior to Visit  Medication Sig Dispense Refill  . ALPRAZolam (XANAX) 0.5 MG tablet 1 to 2 tablets daily as needed 60 tablet 3  . aspirin EC 81 MG tablet Take 1 tablet (81 mg total) by mouth daily. 30 tablet 12  . calcium carbonate (OSCAL) 1500 (600 Ca) MG TABS tablet Take by mouth 2 (two) times daily with a meal.    . naproxen (NAPROSYN) 500 MG tablet Take 1 tablet (500 mg total) by mouth 2 (two) times daily with a meal. (Patient not taking: Reported on 03/13/2019) 60 tablet 1   No current facility-administered medications on file prior to visit.     Objective:     Vitals:   03/13/19 0806  BP: 128/76  Pulse: 67  Physical examination General NAD, Conversant  HEENT Atraumatic; Op clear with mmm.  Normo-cephalic. Pupils reactive. Anicteric sclerae  Thyroid/Neck Smooth without nodularity or enlargement. Normal ROM.  Neck Supple.  Skin No rashes,  lesions or ulceration. Normal palpated skin turgor. No nodularity.  Breasts: No masses or discharge.  Symmetric.  No axillary adenopathy.  Lungs: Clear to auscultation.No rales or wheezes. Normal Respiratory effort, no retractions.  Heart: NSR.  No murmurs or rubs appreciated. No periferal edema  Abdomen: Soft.  Non-tender.  No masses.  No HSM. No hernia  Extremities: Moves all appropriately.  Normal ROM for age. No lymphadenopathy.  Neuro: Oriented to PPT.  Normal mood. Normal affect.     Pelvic:   Vulva: Normal appearance.  No lesions.   Vagina: No lesions or abnormalities noted.  Vaginal atrophy  Support:  Third-degree cystocele  Urethra No masses tenderness or scarring.  Meatus Normal size without lesions or prolapse.  Cervix: Surgically absent   Anus: Normal exam.  No lesions.  Perineum: Normal exam.  No lesions.        Bimanual   Uterus: Surgically absent   Adnexae: No masses.  Non-tender to palpation.  Cul-de-sac: Negative for abnormality.   Exam limited by patient body habitus  Assessment:    [id number] Patient Active Problem List   Diagnosis Date Noted  . Family history of malignant neoplasm of gastrointestinal tract   . Benign neoplasm of descending colon   . Chest pain 06/24/2015  . Combined fat and carbohydrate induced hyperlipemia 06/24/2015  . MI (mitral incompetence) 06/24/2015  . 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 Cystocele, midline 02/12/2015  . Status post laparoscopic assisted vaginal hysterectomy (LAVH) 02/12/2015  . Adenomyosis 02/12/2015  . Abdominal pain, left upper quadrant 11/22/2014  . Abnormal blood level of cobalt 11/22/2014  . Anxiety 11/22/2014  . Arthropathia 11/22/2014  . Decreased libido 11/22/2014  . Clinical depression 11/22/2014  . Fibroid 11/22/2014  . Borderline diabetes 11/22/2014  . History of repair of hip joint 11/22/2014  . Cardiac murmur 11/22/2014  . Titanium poisoning 11/22/2014  . Arthralgia of hip 11/22/2014  . Decreased  potassium in the blood 11/22/2014  . Asymptomatic postmenopausal status 11/22/2014  . Extreme obesity 11/22/2014  . Cramp of limb 11/22/2014  . Bacterial upper respiratory infection 11/22/2014  . Screening examination for poliomyelitis 11/22/2014  . Foot tendinitis 11/22/2014  . Head revolving around 11/22/2014  . Menopause 11/22/2014  . Idiopathic localized osteoarthropathy 10/02/2012     1. Well woman exam   2. Encounter for screening mammogram for breast cancer   3. Status post laparoscopic assisted vaginal hysterectomy (LAVH)   4. Cystocele, midline   5. Morbid obesity with BMI of 40.0-44.9, adult [facility])     Patient reports that she is asymptomatic with her cystocele at this time.   Plan:            1.  Basic Screening Recommendations The basic screening recommendations for asymptomatic women were discussed with the patient during her visit.  The age-appropriate recommendations were discussed with her and the rational for the tests reviewed.  When I am informed by the patient that another primary care physician has previously obtained the age-appropriate tests and they are up-to-date, only outstanding tests are ordered and referrals given as necessary.  Abnormal results of tests will be discussed with her when all of her results are completed.  Routine preventative health maintenance measures emphasized: Exercise/Diet/Weight control, Tobacco Warnings, Alcohol/Substance use risks and Stress Management Mammogram ordered-blood work  ordered. Orders Orders Placed This Encounter  Procedures  . MM 3D SCREEN BREAST BILATERAL- standard of care screening mammo 3d  . Lipid Profile  . HgB A1c  . TSH          F/U  Return in about 1 year (around 03/12/2020) for Annual Physical.  Finis Bud, M.D. 03/13/2019 8:35 AM

## 2019-03-13 NOTE — Progress Notes (Signed)
Patient comes in today for annual exam.

## 2019-03-14 LAB — LIPID PANEL
Chol/HDL Ratio: 3.3 ratio (ref 0.0–4.4)
Cholesterol, Total: 214 mg/dL — ABNORMAL HIGH (ref 100–199)
HDL: 65 mg/dL (ref >39)
LDL Chol Calc (NIH): 136 mg/dL — ABNORMAL HIGH (ref 0–99)
Triglycerides: 75 mg/dL (ref 0–149)
VLDL Cholesterol Cal: 13 mg/dL (ref 5–40)

## 2019-03-14 LAB — HEMOGLOBIN A1C
Est. average glucose Bld gHb Est-mCnc: 114 mg/dL
Hgb A1c MFr Bld: 5.6 % (ref 4.8–5.6)

## 2019-03-14 LAB — TSH: TSH: 1.55 u[IU]/mL (ref 0.450–4.500)

## 2019-03-23 ENCOUNTER — Ambulatory Visit: Payer: BLUE CROSS/BLUE SHIELD | Attending: Internal Medicine

## 2019-03-23 DIAGNOSIS — Z20822 Contact with and (suspected) exposure to covid-19: Secondary | ICD-10-CM

## 2019-03-24 LAB — NOVEL CORONAVIRUS, NAA: SARS-CoV-2, NAA: NOT DETECTED

## 2019-05-15 ENCOUNTER — Encounter: Payer: Self-pay | Admitting: Obstetrics and Gynecology

## 2019-06-12 NOTE — Progress Notes (Signed)
Patient: Andrea Beard Female    DOB: 08-02-1955   64 y.o.   MRN: [id number] Visit Date: 06/13/2019  Today's Provider: Wilhemena Durie, MD   Chief Complaint  Patient presents with  . Abdominal Pain   Subjective:     Abdominal Pain This is a new problem. The current episode started in the past 7 days (1 week ). The problem occurs constantly. The problem has been unchanged. The pain is located in the generalized abdominal region. The pain is at a severity of 6/10. The pain is moderate. The quality of the pain is burning and cramping. The abdominal pain radiates to the pelvis. Associated symptoms include flatus. Pertinent negatives include no anorexia, arthralgias, belching, constipation, diarrhea, dysuria, fever, frequency, headaches, hematochezia, hematuria, melena, myalgias, nausea, vomiting or weight loss. Associated symptoms comments: Bloating and heartburn. The pain is aggravated by eating and movement. She has tried acetaminophen for the symptoms. The treatment provided mild relief.   Patient has had abdominal pain for one week. Pain is located in bilateral flank area and across the middle of her abdomen. Patient stated pain is achin, sometimes cramping and constant. Patient has radiated into the pelvic area. Patient states she has had increased flatulence, bloating and heartburn. Patient states tylenol helps mildly with symptoms. She has no vaginal bleeding, no hematuria, no dysuria, no change in her bowel habits. She does have some bloating.  She is status post partial hysterectomy but she does have both her ovaries still   Allergies  Allergen Reactions  . Oxycodone Rash     Current Outpatient Medications:  .  ALPRAZolam (XANAX) 0.5 MG tablet, 1 to 2 tablets daily as needed, Disp: 60 tablet, Rfl: 3 .  aspirin EC 81 MG tablet, Take 1 tablet (81 mg total) by mouth daily., Disp: 30 tablet, Rfl: 12 .  calcium carbonate (OSCAL) 1500 (600 Ca) MG TABS tablet, Take by  mouth 2 (two) times daily with a meal., Disp: , Rfl:  .  VITAMIN D PO, Take by mouth., Disp: , Rfl:  .  naproxen (NAPROSYN) 500 MG tablet, Take 1 tablet (500 mg total) by mouth 2 (two) times daily with a meal. (Patient not taking: Reported on 03/13/2019), Disp: 60 tablet, Rfl: 1  Review of Systems  Constitutional: Negative for appetite change, chills, fatigue, fever and weight loss.  Eyes: Negative.   Respiratory: Negative for chest tightness and shortness of breath.   Cardiovascular: Negative for chest pain and palpitations.  Gastrointestinal: Positive for flatus. Negative for abdominal pain, anorexia, constipation, diarrhea, hematochezia, melena, nausea and vomiting.  Endocrine: Negative.   Genitourinary: Negative for dysuria, frequency and hematuria.  Musculoskeletal: Negative for arthralgias and myalgias.  Allergic/Immunologic: Negative.   Neurological: Negative for dizziness, weakness and headaches.  Psychiatric/Behavioral: Negative.     Social History   Tobacco Use  . Smoking status: Never Smoker  . Smokeless tobacco: Never Used  Substance Use Topics  . Alcohol use: No    Comment: Non drinker/ No alcohol use.      Objective:   BP 124/80 (BP Location: Right Arm, Patient Position: Sitting, Cuff Size: Large)   Pulse 67   Temp (!) 96.8 F (36 C) (Other (Comment))   Resp 16   Ht 5\' 8"  (1.727 m)   Wt 282 lb (127.9 kg)   SpO2 97%   BMI 42.88 kg/m  Vitals:   06/13/19 0843  BP: 124/80  Pulse: 67  Resp: 16  Temp: (!) 96.8  F (36 C)  TempSrc: Other (Comment)  SpO2: 97%  Weight: 282 lb (127.9 kg)  Height: 5\' 8"  (1.727 m)  Body mass index is 42.88 kg/m.   Physical Exam Vitals reviewed.  Constitutional:      Appearance: Normal appearance. She is obese.  HENT:     Head: Normocephalic and atraumatic.     Right Ear: Tympanic membrane, ear canal and external ear normal.     Left Ear: Tympanic membrane, ear canal and external ear normal.     Nose: Nose normal.      Mouth/Throat:     Mouth: Mucous membranes are moist.     Pharynx: Oropharynx is clear.  Eyes:     Conjunctiva/sclera: Conjunctivae normal.     Pupils: Pupils are equal, round, and reactive to light.  Cardiovascular:     Rate and Rhythm: Normal rate and regular rhythm.     Pulses: Normal pulses.     Heart sounds: Normal heart sounds.  Pulmonary:     Effort: Pulmonary effort is normal.     Breath sounds: Normal breath sounds.  Abdominal:     General: Abdomen is flat. Bowel sounds are normal.     Palpations: Abdomen is soft.     Tenderness: There is abdominal tenderness in the right lower quadrant, suprapubic area and left lower quadrant.  Musculoskeletal:        General: Normal range of motion.     Cervical back: Normal range of motion and neck supple.  Skin:    General: Skin is warm and dry.  Neurological:     General: No focal deficit present.     Mental Status: She is alert and oriented to person, place, and time. Mental status is at baseline.  Psychiatric:        Mood and Affect: Mood normal.        Behavior: Behavior normal.        Thought Content: Thought content normal.        Judgment: Judgment normal.      No results found for any visits on 06/13/19.     Assessment & Plan    1. Generalized abdominal pain She is mildly tender across the lower abdomen with left lower quadrant being a little bit more so. She is status post partial hysterectomy and colonoscopy from 2019 revealed no diverticulosis. She is in no distress today is to obtain lab work and general abdominal ultrasound to begin with. No treatment at this time as I am not sure of the etiology. She has had problems in the past at times with constipation. Plan on seeing her in a few days.  - POCT urinalysis dipstick - Lipase - CBC w/Diff/Platelet - Comprehensive Metabolic Panel (CMET) - US Abdomen Complete - US Pelvic Complete With Transvaginal   Follow up next Tuesday.   I,Neva Ramaswamy,acting as a  scribe for Wilhemena Durie, MD.,have documented all relevant documentation on the behalf of Wilhemena Durie, MD,as directed by  Wilhemena Durie, MD while in the presence of Wilhemena Durie, MD.       Wilhemena Durie, MD  [facility]

## 2019-06-13 ENCOUNTER — Encounter: Payer: Self-pay | Admitting: Family Medicine

## 2019-06-13 ENCOUNTER — Ambulatory Visit (INDEPENDENT_AMBULATORY_CARE_PROVIDER_SITE_OTHER): Payer: Self-pay | Admitting: Family Medicine

## 2019-06-13 ENCOUNTER — Other Ambulatory Visit: Payer: Self-pay

## 2019-06-13 VITALS — BP 124/80 | HR 67 | Temp 96.8°F | Resp 16 | Ht 68.0 in | Wt 282.0 lb

## 2019-06-13 DIAGNOSIS — R102 Pelvic and perineal pain: Secondary | ICD-10-CM

## 2019-06-13 DIAGNOSIS — R1084 Generalized abdominal pain: Secondary | ICD-10-CM

## 2019-06-13 LAB — POCT URINALYSIS DIPSTICK
Appearance: NORMAL
Bilirubin, UA: NEGATIVE
Blood, UA: NEGATIVE
Glucose, UA: NEGATIVE
Ketones, UA: NEGATIVE
Leukocytes, UA: NEGATIVE
Nitrite, UA: NEGATIVE
Odor: NORMAL
Protein, UA: NEGATIVE
Spec Grav, UA: 1.02 (ref 1.010–1.025)
Urobilinogen, UA: 0.2 E.U./dL
pH, UA: 6 (ref 5.0–8.0)

## 2019-06-14 LAB — COMPREHENSIVE METABOLIC PANEL
ALT: 15 IU/L (ref 0–32)
AST: 15 IU/L (ref 0–40)
Albumin/Globulin Ratio: 1.6 (ref 1.2–2.2)
Albumin: 4.3 g/dL (ref 3.8–4.8)
Alkaline Phosphatase: 103 IU/L (ref 39–117)
BUN/Creatinine Ratio: 21 (ref 12–28)
BUN: 14 mg/dL (ref 8–27)
Bilirubin Total: 0.3 mg/dL (ref 0.0–1.2)
CO2: 24 mmol/L (ref 20–29)
Calcium: 9.4 mg/dL (ref 8.7–10.3)
Chloride: 102 mmol/L (ref 96–106)
Creatinine, Ser: 0.67 mg/dL (ref 0.57–1.00)
GFR calc Af Amer: 110 mL/min/{1.73_m2} (ref >59)
GFR calc non Af Amer: 95 mL/min/{1.73_m2} (ref >59)
Globulin, Total: 2.7 g/dL (ref 1.5–4.5)
Glucose: 88 mg/dL (ref 65–99)
Potassium: 4.6 mmol/L (ref 3.5–5.2)
Sodium: 141 mmol/L (ref 134–144)
Total Protein: 7 g/dL (ref 6.0–8.5)

## 2019-06-14 LAB — CBC WITH DIFFERENTIAL/PLATELET
Basophils Absolute: 0.1 10*3/uL (ref 0.0–0.2)
Basos: 1 %
EOS (ABSOLUTE): 0.1 10*3/uL (ref 0.0–0.4)
Eos: 2 %
Hematocrit: 39.8 % (ref 34.0–46.6)
Hemoglobin: 13.5 g/dL (ref 11.1–15.9)
Immature Grans (Abs): 0 10*3/uL (ref 0.0–0.1)
Immature Granulocytes: 0 %
Lymphocytes Absolute: 1.9 10*3/uL (ref 0.7–3.1)
Lymphs: 27 %
MCH: 29.9 pg (ref 26.6–33.0)
MCHC: 33.9 g/dL (ref 31.5–35.7)
MCV: 88 fL (ref 79–97)
Monocytes Absolute: 0.7 10*3/uL (ref 0.1–0.9)
Monocytes: 9 %
Neutrophils Absolute: 4.4 10*3/uL (ref 1.4–7.0)
Neutrophils: 61 %
Platelets: 346 10*3/uL (ref 150–450)
RBC: 4.52 x10E6/uL (ref 3.77–5.28)
RDW: 13.3 % (ref 11.7–15.4)
WBC: 7.1 10*3/uL (ref 3.4–10.8)

## 2019-06-14 LAB — LIPASE: Lipase: 24 U/L (ref 14–72)

## 2019-06-18 NOTE — Progress Notes (Signed)
Patient: Andrea Beard Female    DOB: 1955-09-20   64 y.o.   MRN: [id number] Visit Date: 06/19/2019  Today's Provider: Wilhemena Durie, MD   Chief Complaint  Patient presents with  . Follow-up    Abdominal Pain    Subjective:     HPI  Patient states that abdominal pain is a little better but it still comes and goes.  She is relieved that the general abdominal and pelvic ultrasound was negative.  She still has a little epigastric discomfort.  Mild chronic constipation.  Generalized abdominal pain From 06/13/2019-US transvaginal and US abdomen obtained.   Allergies  Allergen Reactions  . Oxycodone Rash     Current Outpatient Medications:  .  ALPRAZolam (XANAX) 0.5 MG tablet, 1 to 2 tablets daily as needed, Disp: 60 tablet, Rfl: 3 .  aspirin EC 81 MG tablet, Take 1 tablet (81 mg total) by mouth daily., Disp: 30 tablet, Rfl: 12 .  calcium carbonate (OSCAL) 1500 (600 Ca) MG TABS tablet, Take by mouth 2 (two) times daily with a meal., Disp: , Rfl:  .  naproxen (NAPROSYN) 500 MG tablet, Take 1 tablet (500 mg total) by mouth 2 (two) times daily with a meal. (Patient not taking: Reported on 03/13/2019), Disp: 60 tablet, Rfl: 1 .  VITAMIN D PO, Take by mouth., Disp: , Rfl:   Review of Systems  Constitutional: Negative for appetite change, chills, fatigue and fever.  HENT: Negative.   Eyes: Negative.   Respiratory: Negative for chest tightness and shortness of breath.   Cardiovascular: Negative for chest pain and palpitations.  Gastrointestinal: Positive for constipation. Negative for abdominal pain, nausea and vomiting.  Endocrine: Negative.   Musculoskeletal: Negative.   Allergic/Immunologic: Negative.   Neurological: Negative for dizziness and weakness.  Psychiatric/Behavioral: Negative.     Social History   Tobacco Use  . Smoking status: Never Smoker  . Smokeless tobacco: Never Used  Substance Use Topics  . Alcohol use: No    Comment: Non drinker/ No  alcohol use.      Objective:   There were no vitals taken for this visit. There were no vitals filed for this visit.There is no height or weight on file to calculate BMI.   Physical Exam Vitals reviewed.  Constitutional:      Appearance: Normal appearance. She is obese.  HENT:     Head: Normocephalic and atraumatic.     Right Ear: Tympanic membrane, ear canal and external ear normal.     Left Ear: Tympanic membrane, ear canal and external ear normal.     Nose: Nose normal.     Mouth/Throat:     Mouth: Mucous membranes are moist.     Pharynx: Oropharynx is clear.  Eyes:     Conjunctiva/sclera: Conjunctivae normal.     Pupils: Pupils are equal, round, and reactive to light.  Cardiovascular:     Rate and Rhythm: Normal rate and regular rhythm.     Pulses: Normal pulses.     Heart sounds: Normal heart sounds.  Pulmonary:     Effort: Pulmonary effort is normal.     Breath sounds: Normal breath sounds.  Abdominal:     General: Abdomen is flat. Bowel sounds are normal.     Palpations: Abdomen is soft.     Tenderness: There is abdominal tenderness in the right lower quadrant, suprapubic area and left lower quadrant.     Comments: Mild epigastric tenderness.  No guarding rebound  or mass.  Musculoskeletal:        General: Normal range of motion.     Cervical back: Normal range of motion and neck supple.  Skin:    General: Skin is warm and dry.  Neurological:     General: No focal deficit present.     Mental Status: She is alert and oriented to person, place, and time. Mental status is at baseline.  Psychiatric:        Mood and Affect: Mood normal.        Behavior: Behavior normal.        Thought Content: Thought content normal.        Judgment: Judgment normal.      No results found for any visits on 06/19/19.     Assessment & Plan    1. Generalized abdominal pain Try Metamucil for constipation. - pantoprazole (PROTONIX) 40 MG tablet; Take 1 tablet (40 mg total) by  mouth in the morning.  Dispense: 30 tablet; Refill: 0  2. PUD (peptic ulcer disease) Clinically this could be PUD or gastritis.  Protonix and return to clinic 1 month.  Any GI referral if this worsens. - pantoprazole (PROTONIX) 40 MG tablet; Take 1 tablet (40 mg total) by mouth in the morning.  Dispense: 30 tablet; Refill: 0  3. Extreme obesity Diet and exercise has been discussed with patient and she is working on it.     Richard Cranford Mon, MD  [facility]

## 2019-06-19 ENCOUNTER — Encounter: Payer: Self-pay | Admitting: Family Medicine

## 2019-06-19 ENCOUNTER — Ambulatory Visit (INDEPENDENT_AMBULATORY_CARE_PROVIDER_SITE_OTHER): Payer: BLUE CROSS/BLUE SHIELD | Admitting: Family Medicine

## 2019-06-19 ENCOUNTER — Other Ambulatory Visit: Payer: Self-pay

## 2019-06-19 VITALS — BP 132/84 | HR 80 | Temp 93.5°F | Wt 286.0 lb

## 2019-06-19 DIAGNOSIS — E668 Other obesity: Secondary | ICD-10-CM | POA: Diagnosis not present

## 2019-06-19 DIAGNOSIS — K279 Peptic ulcer, site unspecified, unspecified as acute or chronic, without hemorrhage or perforation: Secondary | ICD-10-CM

## 2019-06-19 DIAGNOSIS — R1084 Generalized abdominal pain: Secondary | ICD-10-CM | POA: Diagnosis not present

## 2019-06-19 DIAGNOSIS — E669 Obesity, unspecified: Secondary | ICD-10-CM

## 2019-06-19 MED ORDER — PANTOPRAZOLE SODIUM 40 MG PO TBEC
40.0000 mg | DELAYED_RELEASE_TABLET | Freq: Every morning | ORAL | 0 refills | Status: AC
Start: 2019-06-19 — End: ?

## 2019-06-19 NOTE — Patient Instructions (Signed)
Try Metamucil Daily.  

## 2019-07-25 ENCOUNTER — Ambulatory Visit: Payer: Self-pay | Admitting: Family Medicine

## 2019-09-15 ENCOUNTER — Encounter: Payer: Self-pay | Admitting: Family Medicine

## 2021-11-17 ENCOUNTER — Encounter: Payer: Self-pay | Admitting: Family Medicine

## 2023-12-27 ENCOUNTER — Ambulatory Visit: Admitting: Anesthesiology

## 2023-12-27 ENCOUNTER — Encounter: Payer: Self-pay | Admitting: Gastroenterology

## 2023-12-27 ENCOUNTER — Encounter
Admission: RE | Disposition: A | Discharge status: Home / Self Care | Payer: Self-pay | Source: Home / Self Care | Attending: Gastroenterology

## 2023-12-27 ENCOUNTER — Other Ambulatory Visit: Payer: Self-pay

## 2023-12-27 ENCOUNTER — Ambulatory Visit
Admission: RE | Admit: 2023-12-27 | Discharge: 2023-12-27 | Disposition: A | Discharge status: Home / Self Care | Payer: Self-pay | Attending: Gastroenterology | Admitting: Gastroenterology

## 2023-12-27 DIAGNOSIS — E66813 Obesity, class 3: Secondary | ICD-10-CM | POA: Insufficient documentation

## 2023-12-27 DIAGNOSIS — F419 Anxiety disorder, unspecified: Secondary | ICD-10-CM | POA: Diagnosis not present

## 2023-12-27 DIAGNOSIS — K635 Polyp of colon: Secondary | ICD-10-CM | POA: Insufficient documentation

## 2023-12-27 DIAGNOSIS — Z6841 Body Mass Index (BMI) 40.0 and over, adult: Secondary | ICD-10-CM | POA: Insufficient documentation

## 2023-12-27 DIAGNOSIS — K219 Gastro-esophageal reflux disease without esophagitis: Secondary | ICD-10-CM | POA: Diagnosis not present

## 2023-12-27 DIAGNOSIS — Z1211 Encounter for screening for malignant neoplasm of colon: Secondary | ICD-10-CM | POA: Insufficient documentation

## 2023-12-27 HISTORY — PX: COLONOSCOPY: SHX5424

## 2023-12-27 HISTORY — PX: POLYPECTOMY: SHX149

## 2023-12-27 SURGERY — COLONOSCOPY
Anesthesia: General

## 2023-12-27 MED ORDER — LIDOCAINE HCL (PF) 2 % IJ SOLN
INTRAMUSCULAR | Status: AC
  Filled 2023-12-27: qty 5

## 2023-12-27 MED ORDER — SODIUM CHLORIDE 0.9 % IV SOLN: INTRAVENOUS | Status: DC

## 2023-12-27 MED ORDER — PROPOFOL 10 MG/ML IV BOLUS
INTRAVENOUS | Status: DC | PRN
  Administered 2023-12-27: 100 mg via INTRAVENOUS
  Administered 2023-12-27: 20 mg via INTRAVENOUS

## 2023-12-27 MED ORDER — PROPOFOL 500 MG/50ML IV EMUL
INTRAVENOUS | Status: DC | PRN
  Administered 2023-12-27: 150 ug/kg/min via INTRAVENOUS

## 2023-12-27 MED ORDER — PROPOFOL 1000 MG/100ML IV EMUL
INTRAVENOUS | Status: AC
  Filled 2023-12-27: qty 100

## 2023-12-27 MED ORDER — DEXMEDETOMIDINE HCL IN NACL 80 MCG/20ML IV SOLN
INTRAVENOUS | Status: DC | PRN
  Administered 2023-12-27: 8 ug via INTRAVENOUS

## 2023-12-27 MED ORDER — LIDOCAINE HCL (CARDIAC) PF 100 MG/5ML IV SOSY
PREFILLED_SYRINGE | INTRAVENOUS | Status: DC | PRN
  Administered 2023-12-27: 40 mg via INTRAVENOUS

## 2023-12-27 NOTE — Anesthesia Preprocedure Evaluation (Signed)
 Anesthesia Evaluation  Patient identified by MRN, date of birth, ID band Patient awake    Reviewed: Allergy & Precautions, H&P , NPO status , Patient's Chart, lab work & pertinent test results, reviewed documented beta blocker date and time   History of Anesthesia Complications Negative for: history of anesthetic complications  Airway Mallampati: II  TM Distance: >3 FB Neck ROM: full    Dental  (+) Dental Advidsory Given, Teeth Intact   Pulmonary neg pulmonary ROS          Cardiovascular Exercise Tolerance: Good (-) hypertension(-) angina (-) CAD, (-) Past MI, (-) Cardiac Stents and (-) CABG (-) dysrhythmias + Valvular Problems/Murmurs      Neuro/Psych  PSYCHIATRIC DISORDERS Anxiety Depression    negative neurological ROS     GI/Hepatic Neg liver ROS,GERD  Controlled,,  Endo/Other  neg diabetes  Class 3 obesity  Renal/GU negative Renal ROS  negative genitourinary   Musculoskeletal   Abdominal   Peds  Hematology negative hematology ROS (+)   Anesthesia Other Findings Past Medical History: No date: Anxiety No date: Arthritis No date: Arthropathy No date: Cancer (HCC)     Comment:  skin No date: Depression No date: H/O bilateral hip replacements No date: Hypopotassemia No date: Morbid obesity (HCC)   Reproductive/Obstetrics negative OB ROS                              Anesthesia Physical Anesthesia Plan  ASA: 3  Anesthesia Plan: General   Post-op Pain Management:    Induction: Intravenous  PONV Risk Score and Plan: 3 and Propofol  infusion, TIVA and Treatment may vary due to age or medical condition  Airway Management Planned: Nasal Cannula and Natural Airway  Additional Equipment:   Intra-op Plan:   Post-operative Plan:   Informed Consent: I have reviewed the patients History and Physical, chart, labs and discussed the procedure including the risks, benefits and  alternatives for the proposed anesthesia with the patient or authorized representative who has indicated his/her understanding and acceptance.     Dental Advisory Given  Plan Discussed with: Anesthesiologist, CRNA and Surgeon  Anesthesia Plan Comments:          Anesthesia Quick Evaluation

## 2023-12-27 NOTE — H&P (Signed)
 Ruel Kung , MD [address], [place], [place], [address] Phone: [phone] Fax: [phone]  Primary Care Physician:  Bertrum Charlie CROME, MD   Pre-Procedure History & Physical: HPI:  Andrea Beard is a 68 y.o. female is here for an colonoscopy.   Past Medical History:  Diagnosis Date   Anxiety    Arthritis    Arthropathy    Cancer (HCC)    skin   Depression    H/O bilateral hip replacements    Hypopotassemia    Morbid obesity (HCC)     Past Surgical History:  Procedure Laterality Date   ABDOMINAL HYSTERECTOMY     Dr. Araceli   BREAST BIOPSY Right    negative   BREAST BIOPSY Right 10/24/2017   fibroadenoma, venus marker   BREAST BIOPSY Right 11/15/2017   x shape, USUAL DUCTAL HYPERPLASIA, COLUMNAR CELL CHANGE, SCLEROSING ADENOSIS   COLONOSCOPY WITH PROPOFOL  N/A 04/19/2017   Procedure: COLONOSCOPY WITH PROPOFOL ;  Surgeon: Jinny Carmine, MD;  Location: [facility] ENDOSCOPY;  Service: Endoscopy;  Laterality: N/A;   Left Hip raplacement  Left 2008 and right 2010    Prior to Admission medications   Medication Sig Start Date End Date Taking? Authorizing Provider  ALPRAZolam  (XANAX ) 0.5 MG tablet 1 to 2 tablets daily as needed 06/14/18   Bertrum Charlie CROME, MD  aspirin  EC 81 MG tablet Take 1 tablet (81 mg total) by mouth daily. 07/05/16   Bertrum Charlie CROME, MD  calcium carbonate (OSCAL) 1500 (600 Ca) MG TABS tablet Take by mouth 2 (two) times daily with a meal.    [provider]  naproxen  (NAPROSYN ) 500 MG tablet Take 1 tablet (500 mg total) by mouth 2 (two) times daily with a meal. Patient not taking: Reported on 03/13/2019 02/21/19   Bertrum Charlie CROME, MD  pantoprazole  (PROTONIX ) 40 MG tablet Take 1 tablet (40 mg total) by mouth in the morning. Patient not taking: Reported on 12/27/2023 06/19/19   Bertrum Charlie CROME, MD  VITAMIN D PO Take by mouth.    [provider]    Allergies as of 12/14/2023 - Review Complete 06/19/2019   Allergen Reaction Noted   Oxycodone Rash 11/22/2014    Family History  Problem Relation Age of Onset   Hypertension Mother    Cancer Mother        Breast   Breast cancer Mother 80   Heart disease Father    Hypertension Father    Arthritis Sister    Thyroid disease Brother    Bladder Cancer Brother    Arthritis Sister        arthritis with bilateral hip replacent and multiple myeloma disease   Esophageal cancer Brother    Stomach cancer Brother    Melanoma Brother    Bladder Cancer Brother    Diabetes Paternal Grandfather    Ovarian cancer Neg Hx     Social History   Socioeconomic History   Marital status: Married    Spouse name: terry Coronado   Number of children: 2   Years of education: 12   Highest education level: 12th grade  Occupational History   Not on file  Tobacco Use   Smoking status: Never   Smokeless tobacco: Never  Vaping Use   Vaping status: Never Used  Substance and Sexual Activity   Alcohol use: No    Comment: Non drinker/ No alcohol use.   Drug use: No   Sexual activity: Yes    Birth  control/protection: Surgical  Other Topics Concern   Not on file  Social History Narrative   Not on file   Social Drivers of Health   Financial Resource Strain: Low Risk  (09/13/2023)   Received from [facility]   Overall Financial Resource Strain (CARDIA)    Difficulty of Paying Living Expenses: Not hard at all  Food Insecurity: No Food Insecurity (09/13/2023)   Received from [facility]   Hunger Vital Sign    Within the past 12 months, you worried that your food would run out before you got the money to buy more.: Never true    Within the past 12 months, the food you bought just didn't last and you didn't have money to get more.: Never true  Transportation Needs: No Transportation Needs (09/13/2023)   Received from [facility] - Transportation    In the past 12 months, has lack of  transportation kept you from medical appointments or from getting medications?: No    Lack of Transportation (Non-Medical): No  Physical Activity: Insufficiently Active (02/17/2017)   Exercise Vital Sign    Days of Exercise per Week: 2 days    Minutes of Exercise per Session: 30 min  Stress: Not on file  Social Connections: Moderately Integrated (08/04/2018)   Social Connection and Isolation Panel    Frequency of Communication with Friends and Family: Once a week    Frequency of Social Gatherings with Friends and Family: Once a week    Attends Religious Services: More than 4 times per year    Active Member of [employer] or Organizations: Yes    Attends Engineer, structural: More than 4 times per year    Marital Status: Married  Catering manager Violence: Not At Risk (10/18/2023)   Received from [facility]   Humiliation, Afraid, Rape, and Kick questionnaire    Within the last year, have you been afraid of your partner or ex-partner?: No    Within the last year, have you been humiliated or emotionally abused in other ways by your partner or ex-partner?: No    Within the last year, have you been kicked, hit, slapped, or otherwise physically hurt by your partner or ex-partner?: No    Within the last year, have you been raped or forced to have any kind of sexual activity by your partner or ex-partner?: No    Review of Systems: See HPI, otherwise negative ROS  Physical Exam: BP (!) 135/95 Comment: retake lower R arm  Pulse (!) 112   Temp (!) 97.2 F (36.2 C)   Resp 16   Ht 5' 7 (1.702 m)   Wt 117 kg   SpO2 99%   BMI 40.41 kg/m  General:   Alert,  pleasant and cooperative in NAD Head:  Normocephalic and atraumatic. Neck:  Supple; no masses or thyromegaly. Lungs:  Clear throughout to auscultation, normal respiratory effort.    Heart:  +S1, +S2, Regular rate and rhythm, No edema. Abdomen:  Soft, nontender and nondistended. Normal bowel sounds, without guarding, and without  rebound.   Neurologic:  Alert and  oriented x4;  grossly normal neurologically.  Impression/Plan: Andrea Beard is here for an colonoscopy to be performed for surveillance due to prior history of colon polyps   Risks, benefits, limitations, and alternatives regarding  colonoscopy have been reviewed with the patient.  Questions have been answered.  All parties agreeable.   Ruel Kung, MD  12/27/2023,  7:46 AM

## 2023-12-27 NOTE — Transfer of Care (Signed)
 Immediate Anesthesia Transfer of Care Note  Patient: Andrea Beard  Procedure(s) Performed: Procedure(s): COLONOSCOPY (N/A) POLYPECTOMY, INTESTINE  Patient Location: PACU and Endoscopy Unit  Anesthesia Type:General  Level of Consciousness: sedated  Airway & Oxygen Therapy: Patient Spontanous Breathing and Patient connected to nasal cannula oxygen  Post-op Assessment: Report given to RN and Post -op Vital signs reviewed and stable  Post vital signs: Reviewed and stable  Last Vitals:  Vitals:   12/27/23 0738 12/27/23 0839  BP: (!) 135/95 (!) 92/49  Pulse:  92  Resp:  (!) 22  Temp:    SpO2:  97%    Complications: No apparent anesthesia complications

## 2023-12-27 NOTE — Op Note (Signed)
 [facility] Patient Name: Andrea Beard Procedure Date: 12/27/2023 8:13 AM MRN: [id number] Account #: [id number] Date of Birth: 1955/12/09 Admit Type: Outpatient Age: 68 Room: [facility] ENDO ROOM 1 Gender: Female Note Status: Finalized Instrument Name: Colon Scope [id number] Procedure:             Colonoscopy Indications:           Screening for colorectal malignant neoplasm Providers:             Ruel Kung MD, MD Referring MD:          Charlie CROME. Bertrum, MD (Referring MD) Medicines:             Monitored Anesthesia Care Complications:         No immediate complications. Procedure:             Pre-Anesthesia Assessment:                        - Prior to the procedure, a History and Physical was                         performed, and patient medications, allergies and                         sensitivities were reviewed. The patient's tolerance                         of previous anesthesia was reviewed.                        - The risks and benefits of the procedure and the                         sedation options and risks were discussed with the                         patient. All questions were answered and informed                         consent was obtained.                        - ASA Grade Assessment: II - A patient with mild                         systemic disease.                        After obtaining informed consent, the colonoscope was                         passed under direct vision. Throughout the procedure,                         the patient's blood pressure, pulse, and oxygen                         saturations were monitored continuously. The                         Colonoscope  was introduced through the anus and                         advanced to the the cecum, identified by the                         appendiceal orifice. The colonoscopy was performed                         with ease. The patient tolerated the procedure  well.                         The quality of the bowel preparation was excellent.                         The ileocecal valve, appendiceal orifice, and rectum                         were photographed. Findings:      The perianal and digital rectal examinations were normal.      A 3 mm polyp was found in the transverse colon. The polyp was sessile.       The polyp was removed with a jumbo cold forceps. Resection and retrieval       were complete.      The exam was otherwise without abnormality on direct and retroflexion       views. Impression:            - One 3 mm polyp in the transverse colon, removed with                         a jumbo cold forceps. Resected and retrieved.                        - The examination was otherwise normal on direct and                         retroflexion views. Recommendation:        - Discharge patient to home (with escort).                        - Resume previous diet.                        - Continue present medications.                        - Await pathology results.                        - Repeat colonoscopy for surveillance based on                         pathology results. Procedure Code(s):     --- Professional ---                        [phone], Colonoscopy, flexible; with biopsy, single or                         multiple Diagnosis  Code(s):     --- Professional ---                        Z12.11, Encounter for screening for malignant neoplasm                         of colon                        D12.3, Benign neoplasm of transverse colon (hepatic                         flexure or splenic flexure) CPT copyright 2022 American Medical Association. All rights reserved. The codes documented in this report are preliminary and upon coder review may  be revised to meet current compliance requirements. Ruel Kung, MD Ruel Kung MD, MD 12/27/2023 8:37:44 AM This report has been signed electronically. Number of Addenda: 0 Note Initiated On:  12/27/2023 8:13 AM Scope Withdrawal Time: 0 hours 8 minutes 20 seconds  Total Procedure Duration: 0 hours 14 minutes 1 second  Estimated Blood Loss:  Estimated blood loss: none.      [facility]

## 2023-12-27 NOTE — Anesthesia Procedure Notes (Signed)
 Date/Time: 12/27/2023 8:17 AM  Performed by: Tod Handing, CRNAPre-anesthesia Checklist: Patient identified, Emergency Drugs available, Suction available and Patient being monitored Patient Re-evaluated:Patient Re-evaluated prior to induction Oxygen Delivery Method: Nasal cannula Induction Type: IV induction Dental Injury: Teeth and Oropharynx as per pre-operative assessment  Comments: Nasal cannula with etCO2 monitoring

## 2023-12-28 ENCOUNTER — Encounter: Payer: Self-pay | Admitting: Gastroenterology

## 2023-12-28 LAB — SURGICAL PATHOLOGY

## 2024-01-04 NOTE — Anesthesia Postprocedure Evaluation (Signed)
 Anesthesia Post Note  Patient: Andrea Beard  Procedure(s) Performed: COLONOSCOPY POLYPECTOMY, INTESTINE  Patient location during evaluation: Endoscopy Anesthesia Type: General Level of consciousness: awake and alert Pain management: pain level controlled Vital Signs Assessment: post-procedure vital signs reviewed and stable Respiratory status: spontaneous breathing, nonlabored ventilation, respiratory function stable and patient connected to nasal cannula oxygen Cardiovascular status: blood pressure returned to baseline and stable Postop Assessment: no apparent nausea or vomiting Anesthetic complications: no   No notable events documented.   Last Vitals:  Vitals:   12/27/23 0839 12/27/23 0849  BP: (!) 92/49 104/71  Pulse: 92 88  Resp: (!) 22 (!) 24  Temp: (!) 36.1 C   SpO2: 97% 97%    Last Pain:  Vitals:   12/27/23 0858  TempSrc:   PainSc: 0-No pain                 Prentice Murphy
